# Patient Record
Sex: Female | Born: 1935 | Race: White | Hispanic: No | Marital: Single | Smoking: Former smoker
Health system: Southern US, Community
[De-identification: ages and names within clinical notes are randomized; demographics above are authoritative.]

## PROBLEM LIST (undated history)

## (undated) DIAGNOSIS — N289 Disorder of kidney and ureter, unspecified: Secondary | ICD-10-CM

## (undated) DIAGNOSIS — I10 Essential (primary) hypertension: Secondary | ICD-10-CM

## (undated) DIAGNOSIS — F0390 Unspecified dementia without behavioral disturbance: Secondary | ICD-10-CM

## (undated) HISTORY — PX: HERNIA REPAIR: SHX51

## (undated) HISTORY — PX: BACK SURGERY: SHX140

---

## 1998-04-21 ENCOUNTER — Observation Stay (HOSPITAL_COMMUNITY): Admission: RE | Admit: 1998-04-21 | Discharge: 1998-04-22 | Payer: Self-pay | Admitting: Surgery

## 1998-04-21 ENCOUNTER — Encounter: Payer: Self-pay | Admitting: Surgery

## 1999-06-09 ENCOUNTER — Encounter: Payer: Self-pay | Admitting: Surgery

## 1999-06-09 ENCOUNTER — Encounter: Admission: RE | Admit: 1999-06-09 | Discharge: 1999-06-09 | Payer: Self-pay | Admitting: Surgery

## 1999-06-09 ENCOUNTER — Other Ambulatory Visit: Admission: RE | Admit: 1999-06-09 | Discharge: 1999-06-09 | Payer: Self-pay | Admitting: Gastroenterology

## 1999-06-09 ENCOUNTER — Encounter (INDEPENDENT_AMBULATORY_CARE_PROVIDER_SITE_OTHER): Payer: Self-pay | Admitting: Specialist

## 2000-01-26 ENCOUNTER — Encounter: Payer: Self-pay | Admitting: Family Medicine

## 2000-01-26 ENCOUNTER — Encounter: Admission: RE | Admit: 2000-01-26 | Discharge: 2000-01-26 | Payer: Self-pay | Admitting: Family Medicine

## 2000-02-05 ENCOUNTER — Encounter: Payer: Self-pay | Admitting: General Surgery

## 2000-02-10 ENCOUNTER — Encounter (INDEPENDENT_AMBULATORY_CARE_PROVIDER_SITE_OTHER): Payer: Self-pay

## 2000-02-10 ENCOUNTER — Inpatient Hospital Stay (HOSPITAL_COMMUNITY): Admission: RE | Admit: 2000-02-10 | Discharge: 2000-02-13 | Payer: Self-pay | Admitting: General Surgery

## 2000-07-12 ENCOUNTER — Encounter: Admission: RE | Admit: 2000-07-12 | Discharge: 2000-07-12 | Payer: Self-pay | Admitting: Family Medicine

## 2000-07-12 ENCOUNTER — Encounter: Payer: Self-pay | Admitting: Family Medicine

## 2003-09-12 ENCOUNTER — Encounter: Admission: RE | Admit: 2003-09-12 | Discharge: 2003-09-12 | Payer: Self-pay | Admitting: Family Medicine

## 2003-09-27 ENCOUNTER — Encounter: Admission: RE | Admit: 2003-09-27 | Discharge: 2003-09-27 | Payer: Self-pay | Admitting: Neurological Surgery

## 2003-09-29 ENCOUNTER — Ambulatory Visit (HOSPITAL_COMMUNITY): Admission: RE | Admit: 2003-09-29 | Discharge: 2003-09-29 | Payer: Self-pay | Admitting: Neurological Surgery

## 2003-10-08 ENCOUNTER — Ambulatory Visit (HOSPITAL_COMMUNITY): Admission: RE | Admit: 2003-10-08 | Discharge: 2003-10-08 | Payer: Self-pay | Admitting: Neurological Surgery

## 2003-10-14 ENCOUNTER — Ambulatory Visit (HOSPITAL_COMMUNITY): Admission: RE | Admit: 2003-10-14 | Discharge: 2003-10-14 | Payer: Self-pay | Admitting: Unknown Physician Specialty

## 2010-05-15 ENCOUNTER — Ambulatory Visit: Payer: Self-pay | Admitting: Diagnostic Radiology

## 2010-05-15 ENCOUNTER — Encounter: Payer: Self-pay | Admitting: Emergency Medicine

## 2010-07-02 ENCOUNTER — Inpatient Hospital Stay (HOSPITAL_COMMUNITY): Admission: EM | Admit: 2010-07-02 | Discharge: 2010-05-17 | Payer: Self-pay | Admitting: Internal Medicine

## 2010-10-07 LAB — LIPID PANEL
LDL Cholesterol: 112 mg/dL — ABNORMAL HIGH (ref 0–99)
Total CHOL/HDL Ratio: 3.8 RATIO
Triglycerides: 102 mg/dL (ref <150)
VLDL: 20 mg/dL (ref 0–40)

## 2010-10-07 LAB — CARDIAC PANEL(CRET KIN+CKTOT+MB+TROPI)
CK, MB: 2.8 ng/mL (ref 0.3–4.0)
CK, MB: 3.3 ng/mL (ref 0.3–4.0)
Total CK: 129 U/L (ref 7–177)
Troponin I: 0.01 ng/mL (ref 0.00–0.06)

## 2010-10-07 LAB — DIFFERENTIAL
Basophils Absolute: 0 10*3/uL (ref 0.0–0.1)
Basophils Relative: 0 % (ref 0–1)
Eosinophils Absolute: 0.8 10*3/uL — ABNORMAL HIGH (ref 0.0–0.7)
Eosinophils Relative: 11 % — ABNORMAL HIGH (ref 0–5)
Lymphocytes Relative: 24 % (ref 12–46)
Monocytes Absolute: 0.8 10*3/uL (ref 0.1–1.0)
Monocytes Absolute: 0.5 10*3/uL (ref 0.1–1.0)
Monocytes Relative: 10 % (ref 3–12)
Monocytes Relative: 8 % (ref 3–12)
Neutro Abs: 4 10*3/uL (ref 1.7–7.7)
Neutro Abs: 4.1 10*3/uL (ref 1.7–7.7)

## 2010-10-07 LAB — TSH: TSH: 1.264 u[IU]/mL (ref 0.350–4.500)

## 2010-10-07 LAB — POCT CARDIAC MARKERS
CKMB, poc: 1.4 ng/mL (ref 1.0–8.0)
Troponin i, poc: <0.05 ng/mL (ref 0.00–0.09)

## 2010-10-07 LAB — BASIC METABOLIC PANEL
BUN: 33 mg/dL — ABNORMAL HIGH (ref 6–23)
Calcium: 8.7 mg/dL (ref 8.4–10.5)
Chloride: 112 mEq/L (ref 96–112)
Chloride: 107 mEq/L (ref 96–112)
Creatinine, Ser: 1.19 mg/dL (ref 0.4–1.2)
GFR calc non Af Amer: 43 mL/min — ABNORMAL LOW (ref >60)
GFR calc non Af Amer: 43 mL/min — ABNORMAL LOW (ref >60)
GFR calc non Af Amer: 38 mL/min — ABNORMAL LOW (ref >60)
Glucose, Bld: 106 mg/dL — ABNORMAL HIGH (ref 70–99)
Glucose, Bld: 93 mg/dL (ref 70–99)
Glucose, Bld: 93 mg/dL (ref 70–99)
Potassium: 4.1 mEq/L (ref 3.5–5.1)
Potassium: 5.2 mEq/L — ABNORMAL HIGH (ref 3.5–5.1)
Sodium: 138 mEq/L (ref 135–145)
Sodium: 141 mEq/L (ref 135–145)

## 2010-10-07 LAB — CBC
HCT: 32.7 % — ABNORMAL LOW (ref 36.0–46.0)
HCT: 35.2 % — ABNORMAL LOW (ref 36.0–46.0)
Hemoglobin: 10.9 g/dL — ABNORMAL LOW (ref 12.0–15.0)
Hemoglobin: 11.8 g/dL — ABNORMAL LOW (ref 12.0–15.0)
MCH: 30.8 pg (ref 26.0–34.0)
MCHC: 33.3 g/dL (ref 30.0–36.0)
MCHC: 33.6 g/dL (ref 30.0–36.0)
MCV: 92.4 fL (ref 78.0–100.0)
MCV: 95.1 fL (ref 78.0–100.0)
RDW: 13.9 % (ref 11.5–15.5)
WBC: 7 10*3/uL (ref 4.0–10.5)

## 2010-10-07 LAB — MRSA PCR SCREENING: MRSA by PCR: NEGATIVE

## 2010-12-11 NOTE — Op Note (Signed)
[facility]  Patient:    Amy Moody                   MRN: [id number] Proc. Date: 02/10/00 Adm. Date:  [id number] Attending:  Henrene Dodge CC:         Anselm Pancoast. Zachery Dakins, M.D.                           Operative Report  PREOPERATIVE DIAGNOSES:  Giant incisional hernia status post herniorrhaphies with mesh reinforcement x 2.  POSTOPERATIVE DIAGNOSES:  Giant incisional hernia status post herniorrhaphies with mesh reinforcement x 2.  OPERATION PERFORMED:  Excisional herniorrhaphy.  ANESTHESIA:  General.  SURGEON:  Anselm Pancoast. Zachery Dakins, M.D.  ASSISTANT:  Donnie Coffin. Samuella Cota, M.D.  HISTORY OF PRESENT ILLNESS:  Amy Moody is an 77 year old slightly overweight Caucasian female whose been a patient of Dr. Butch Penny for many years. I think she first had an incisional hernia and an old GYN type incision approximately 10 years ago and then approximately, I think, 2 years ago had a bowel obstruction with a hernia above the mesh that was repaired by Dr. Orson Slick. Shortly afterwards, she developed a cholecystitis or actually was having biliary colic and had a laparoscopic cholecystectomy by Dr. Ezzard Standing and now she returns to the office with a large hernia. She actually has a big defect basically football size and then also a definite orange size defect up in the upper epigastric area where the actual 10 mm trocar was placed. She does not want to wait until Dr. Ezzard Standing returns from his trip to [place] and asked that I proceed on with herniorrhaphy as she was having a moderate amount of pain. However, we have not noticed any actual bowel obstruction and she has not blood in the stool and she had a recent CT that was nothing except for the large hernias noted. She had a mechanical bowel prep preoperatively and was given a gram of Kefzol with PAS stockings and was taken to the operative suite. I prepped the abdomen with Betadine surgical scrub and  solution and a Foley catheter had been inserted sterilely and then the epigastric and midline area was all draped. I made the first incision above the umbilicus from the umbilicus to about halfway to the subxiphoid area, dissected down and then basically encompassed this large hernia. She does actually have a defect about 6 inches by about 4 inches but then the mesh had just lifted and the mesh had been placed on top of the abdomen or top of the fascia when Dr. Orson Slick had repaired his herniorrhaphy. This I tried to separate and save the actual hernia sac so I could just place everything extraperitoneally but it was impossible and it was necessary to go within the peritoneal cavity. She does not have an enormous or large omentum but was separated from the fascia and also the old mesh and the small bowel was normal appearing. Good hemostasis was obtained and then the piece of mesh that Dr. Ezzard Standing had placed. I think it had actually been placed preperitoneally from within the peritoneal cavity and this basically had held at the bottom aspect but had given way for Dr. Orson Slick where Dr. Orson Slick had repaired the hernia. Feeling up in the upper abdomen, the little fascia defect was about the size about an inch and a half or so and then she had a big mushroom up above that from the hernia sac.  We made a little incision above the skin where trocar site was and then after this the hernia sac was freed up around the fascia. Basically fixed it so a large piece of Prolene mesh could be placed really outside the peritoneum but there is not much of a posterior rectus fascia and it was kind of up on the rectus muscle on both sides. I tried to close the peritoneum the best we could, of course she has had such a large fascia defect that the area right at the umbilicus and everything a portion of it was up and down and part was kind of transverse but it appears to be intact. Sponge count was correct in this and  this large piece of mesh is probably 8 inches x 12 inches in size sort of tapered down a little bit up at the top part. First we anchored the mesh and then the preperitoneal position at the xiphoid area and then anchored the second stitch down really right above the bladder. I first worked on the right lateral side going the mesh just as far laterally as possible and then after this was anchored with probably about 8 sutures on that side then the similar was done on the left side switching the surgeons side. We then closed the fascia bringing it down to the mesh. It was not possible to actually bring the fascia at the xiphoid or the umbilicus area. The fascia was then sutured to the mesh. There was probably about a strip about an 1.5 inches x 3 inches that there was no fascia over the mesh. She was kind of getting a little light and the mesh appears to be lying flat and I dont think there is any excessive tension areas. I did place a 19 Blake drain up under the muscle on top of the mesh on the left side and brought it around. Next, the subcutaneous tissue of the fascia had been closed as possible with #0 Prolenes and then the fascia right around from it couldnt get the fascia together and then closed to the mesh with the #0 Prolenes. Next, the subcutaneous tissue was closed with 2-0 or 3-0 Vicryl and then the skin was closed with staples after a few interrupted sutures had been placed in the subcuticular area. At this time, she was actually breathing on her own and the abdomen is flat. I think that hopefully this will not rip out because of the large areas that we went under the fascia in all directions. I then put skin staples and then placed sterile occlusive dressings to drain and then suture to the skin and then the abdominal binder after the dressing had been applied. The patient tolerated the procedure satisfactory was extubated and taken to the recovery room stable  postop condition. DD:  02/10/00 TD:  02/12/00 Job: [id number]

## 2010-12-11 NOTE — Discharge Summary (Signed)
[facility]  Patient:    Amy Moody, Amy Moody                   MRN: [id number] Adm. Date:  [id number] Disc. Date: [id number] Attending:  Henrene Dodge CC:         Anselm Pancoast. Zachery Dakins, M.D.   Discharge Summary  DISCHARGE DIAGNOSIS:  Multiple incisional hernias  OPERATION:  Repair of multiple incisional hernias with a large piece of Prolene mesh under general anesthesia.  HISTORY:  The patient is an 77 year old Caucasian female who has had multiple previous abdominal surgeries, most for repair of incisional hernias.  She originally had been operated on by Dr. Ezzard Standing after she had had gynecological procedure with a lower incisional hernia, and then several years after that had an incarcerated hernia above the piece of mesh.  It was operated by Dr. Ezzard Standing.  Later, she had chronic cholecystits, and Dr. Ezzard Standing operated on her with a laparoscopic cholecystectomy, and this was approximately two years ago.  She has done nicely with the exception that she has developed a large hernia at the umbilicus and also a large defect at the upper 10 mm trocar site.  She saw me in the office Dr. Butch Penny absence because of pain.  She had about a football size defect of the umbilicus, and I recommended that this be repaired, and she desired to proceed at that time.   Dr. Dewaine Oats is her regular physician.  She has had multiple lower abdominal surgeries, gynecological, and I thought that it would be best to try to repair this with a large piece of mesh placed within the muscle layers but extraperiotneally.  She was taken to surgery.  Dr. Rozetta Nunnery assisted in her surgery.  We were able to take down all of the basically incisional hernias.  We could keep the peritoneum predominantly intact and closed the peritoneum and then placed a piece of mesh measuring approximately 9 x 12 inches basically within the muscle layer, tacked this in place with lateral sutures, and  then closed the fascia over the mesh.  A Jackson-Pratt drain was placed and then closed the abdominal incision, and we kept her n.p.o. for approximately two days because of the magnitude of her dissection and surgeries.  She then started having some bowel function.  The nausea subsided, and we started her on a liquid diet.  She was ready for discharge in improved condition on approximately the third postoperative day.  She still has her Jackson-Pratt drain in place and will see me in the office to have the drain removed in approximately three to four days.  She was discharged on Tylenol and Vicodin for pain, her usual medications, which were not really many, and instructed to wear the binder on at all times. She is discharged in improved condition and is to use laxatives if needed for bowels. DD:  02/23/00 TD:  02/24/00 Job: [id number]

## 2011-08-25 DIAGNOSIS — L608 Other nail disorders: Secondary | ICD-10-CM | POA: Diagnosis not present

## 2011-09-21 DIAGNOSIS — R3915 Urgency of urination: Secondary | ICD-10-CM | POA: Diagnosis not present

## 2011-10-11 DIAGNOSIS — I10 Essential (primary) hypertension: Secondary | ICD-10-CM | POA: Diagnosis not present

## 2012-01-09 DIAGNOSIS — M25519 Pain in unspecified shoulder: Secondary | ICD-10-CM | POA: Diagnosis not present

## 2012-02-10 DIAGNOSIS — F0280 Dementia in other diseases classified elsewhere without behavioral disturbance: Secondary | ICD-10-CM | POA: Diagnosis not present

## 2012-02-10 DIAGNOSIS — I10 Essential (primary) hypertension: Secondary | ICD-10-CM | POA: Diagnosis not present

## 2012-02-10 DIAGNOSIS — N183 Chronic kidney disease, stage 3 unspecified: Secondary | ICD-10-CM | POA: Diagnosis not present

## 2012-02-10 DIAGNOSIS — G309 Alzheimer's disease, unspecified: Secondary | ICD-10-CM | POA: Diagnosis not present

## 2012-03-29 DIAGNOSIS — Z961 Presence of intraocular lens: Secondary | ICD-10-CM | POA: Diagnosis not present

## 2012-03-29 DIAGNOSIS — H1045 Other chronic allergic conjunctivitis: Secondary | ICD-10-CM | POA: Diagnosis not present

## 2012-03-29 DIAGNOSIS — H04129 Dry eye syndrome of unspecified lacrimal gland: Secondary | ICD-10-CM | POA: Diagnosis not present

## 2012-03-29 DIAGNOSIS — H26499 Other secondary cataract, unspecified eye: Secondary | ICD-10-CM | POA: Diagnosis not present

## 2012-04-24 DIAGNOSIS — Z23 Encounter for immunization: Secondary | ICD-10-CM | POA: Diagnosis not present

## 2012-05-18 DIAGNOSIS — B86 Scabies: Secondary | ICD-10-CM | POA: Diagnosis not present

## 2012-07-25 DIAGNOSIS — R42 Dizziness and giddiness: Secondary | ICD-10-CM | POA: Diagnosis not present

## 2012-08-14 DIAGNOSIS — R82998 Other abnormal findings in urine: Secondary | ICD-10-CM | POA: Diagnosis not present

## 2012-08-14 DIAGNOSIS — N183 Chronic kidney disease, stage 3 unspecified: Secondary | ICD-10-CM | POA: Diagnosis not present

## 2012-08-14 DIAGNOSIS — R42 Dizziness and giddiness: Secondary | ICD-10-CM | POA: Diagnosis not present

## 2013-01-09 DIAGNOSIS — R05 Cough: Secondary | ICD-10-CM | POA: Diagnosis not present

## 2013-01-09 DIAGNOSIS — R059 Cough, unspecified: Secondary | ICD-10-CM | POA: Diagnosis not present

## 2013-02-19 DIAGNOSIS — R059 Cough, unspecified: Secondary | ICD-10-CM | POA: Diagnosis not present

## 2013-02-19 DIAGNOSIS — F0280 Dementia in other diseases classified elsewhere without behavioral disturbance: Secondary | ICD-10-CM | POA: Diagnosis not present

## 2013-02-19 DIAGNOSIS — L2089 Other atopic dermatitis: Secondary | ICD-10-CM | POA: Diagnosis not present

## 2013-02-19 DIAGNOSIS — N183 Chronic kidney disease, stage 3 unspecified: Secondary | ICD-10-CM | POA: Diagnosis not present

## 2013-02-19 DIAGNOSIS — G309 Alzheimer's disease, unspecified: Secondary | ICD-10-CM | POA: Diagnosis not present

## 2013-02-19 DIAGNOSIS — R05 Cough: Secondary | ICD-10-CM | POA: Diagnosis not present

## 2013-02-19 DIAGNOSIS — I10 Essential (primary) hypertension: Secondary | ICD-10-CM | POA: Diagnosis not present

## 2013-04-20 DIAGNOSIS — Z23 Encounter for immunization: Secondary | ICD-10-CM | POA: Diagnosis not present

## 2013-08-22 DIAGNOSIS — N183 Chronic kidney disease, stage 3 unspecified: Secondary | ICD-10-CM | POA: Diagnosis not present

## 2013-08-22 DIAGNOSIS — M545 Low back pain, unspecified: Secondary | ICD-10-CM | POA: Diagnosis not present

## 2013-08-22 DIAGNOSIS — L609 Nail disorder, unspecified: Secondary | ICD-10-CM | POA: Diagnosis not present

## 2013-08-22 DIAGNOSIS — F0280 Dementia in other diseases classified elsewhere without behavioral disturbance: Secondary | ICD-10-CM | POA: Diagnosis not present

## 2013-08-22 DIAGNOSIS — G309 Alzheimer's disease, unspecified: Secondary | ICD-10-CM | POA: Diagnosis not present

## 2013-08-22 DIAGNOSIS — I10 Essential (primary) hypertension: Secondary | ICD-10-CM | POA: Diagnosis not present

## 2014-03-25 DIAGNOSIS — I10 Essential (primary) hypertension: Secondary | ICD-10-CM | POA: Diagnosis not present

## 2014-03-25 DIAGNOSIS — B351 Tinea unguium: Secondary | ICD-10-CM | POA: Diagnosis not present

## 2014-03-25 DIAGNOSIS — F0280 Dementia in other diseases classified elsewhere without behavioral disturbance: Secondary | ICD-10-CM | POA: Diagnosis not present

## 2014-03-25 DIAGNOSIS — G309 Alzheimer's disease, unspecified: Secondary | ICD-10-CM | POA: Diagnosis not present

## 2014-03-25 DIAGNOSIS — L299 Pruritus, unspecified: Secondary | ICD-10-CM | POA: Diagnosis not present

## 2014-03-25 DIAGNOSIS — N183 Chronic kidney disease, stage 3 unspecified: Secondary | ICD-10-CM | POA: Diagnosis not present

## 2014-04-26 DIAGNOSIS — Z23 Encounter for immunization: Secondary | ICD-10-CM | POA: Diagnosis not present

## 2014-05-23 DIAGNOSIS — S8990XA Unspecified injury of unspecified lower leg, initial encounter: Secondary | ICD-10-CM | POA: Diagnosis not present

## 2014-05-23 DIAGNOSIS — S99921A Unspecified injury of right foot, initial encounter: Secondary | ICD-10-CM | POA: Diagnosis not present

## 2014-05-23 DIAGNOSIS — S92411A Displaced fracture of proximal phalanx of right great toe, initial encounter for closed fracture: Secondary | ICD-10-CM | POA: Diagnosis not present

## 2014-06-10 DIAGNOSIS — S92411S Displaced fracture of proximal phalanx of right great toe, sequela: Secondary | ICD-10-CM | POA: Diagnosis not present

## 2014-09-16 DIAGNOSIS — I129 Hypertensive chronic kidney disease with stage 1 through stage 4 chronic kidney disease, or unspecified chronic kidney disease: Secondary | ICD-10-CM | POA: Diagnosis not present

## 2014-09-16 DIAGNOSIS — N183 Chronic kidney disease, stage 3 (moderate): Secondary | ICD-10-CM | POA: Diagnosis not present

## 2014-09-16 DIAGNOSIS — M545 Low back pain: Secondary | ICD-10-CM | POA: Diagnosis not present

## 2014-09-16 DIAGNOSIS — S92414G Nondisplaced fracture of proximal phalanx of right great toe, subsequent encounter for fracture with delayed healing: Secondary | ICD-10-CM | POA: Diagnosis not present

## 2014-09-16 DIAGNOSIS — G301 Alzheimer's disease with late onset: Secondary | ICD-10-CM | POA: Diagnosis not present

## 2015-03-17 DIAGNOSIS — N183 Chronic kidney disease, stage 3 (moderate): Secondary | ICD-10-CM | POA: Diagnosis not present

## 2015-03-17 DIAGNOSIS — G301 Alzheimer's disease with late onset: Secondary | ICD-10-CM | POA: Diagnosis not present

## 2015-03-17 DIAGNOSIS — I129 Hypertensive chronic kidney disease with stage 1 through stage 4 chronic kidney disease, or unspecified chronic kidney disease: Secondary | ICD-10-CM | POA: Diagnosis not present

## 2015-05-06 DIAGNOSIS — Z23 Encounter for immunization: Secondary | ICD-10-CM | POA: Diagnosis not present

## 2015-08-08 DIAGNOSIS — J988 Other specified respiratory disorders: Secondary | ICD-10-CM | POA: Diagnosis not present

## 2015-08-08 DIAGNOSIS — R05 Cough: Secondary | ICD-10-CM | POA: Diagnosis not present

## 2015-12-19 DIAGNOSIS — Z87891 Personal history of nicotine dependence: Secondary | ICD-10-CM | POA: Diagnosis not present

## 2015-12-19 DIAGNOSIS — R55 Syncope and collapse: Secondary | ICD-10-CM | POA: Diagnosis not present

## 2015-12-19 DIAGNOSIS — K219 Gastro-esophageal reflux disease without esophagitis: Secondary | ICD-10-CM | POA: Diagnosis not present

## 2015-12-19 DIAGNOSIS — Z79899 Other long term (current) drug therapy: Secondary | ICD-10-CM | POA: Diagnosis not present

## 2015-12-19 DIAGNOSIS — T671XXA Heat syncope, initial encounter: Secondary | ICD-10-CM | POA: Diagnosis not present

## 2015-12-19 DIAGNOSIS — R404 Transient alteration of awareness: Secondary | ICD-10-CM | POA: Diagnosis not present

## 2015-12-19 DIAGNOSIS — F0390 Unspecified dementia without behavioral disturbance: Secondary | ICD-10-CM | POA: Diagnosis not present

## 2015-12-19 DIAGNOSIS — K279 Peptic ulcer, site unspecified, unspecified as acute or chronic, without hemorrhage or perforation: Secondary | ICD-10-CM | POA: Diagnosis not present

## 2015-12-19 DIAGNOSIS — I10 Essential (primary) hypertension: Secondary | ICD-10-CM | POA: Diagnosis not present

## 2015-12-19 DIAGNOSIS — F0280 Dementia in other diseases classified elsewhere without behavioral disturbance: Secondary | ICD-10-CM | POA: Diagnosis not present

## 2015-12-19 DIAGNOSIS — G301 Alzheimer's disease with late onset: Secondary | ICD-10-CM | POA: Diagnosis not present

## 2015-12-19 DIAGNOSIS — G9389 Other specified disorders of brain: Secondary | ICD-10-CM | POA: Diagnosis not present

## 2015-12-19 DIAGNOSIS — R410 Disorientation, unspecified: Secondary | ICD-10-CM | POA: Diagnosis not present

## 2015-12-20 DIAGNOSIS — S8000XA Contusion of unspecified knee, initial encounter: Secondary | ICD-10-CM | POA: Diagnosis not present

## 2015-12-20 DIAGNOSIS — M542 Cervicalgia: Secondary | ICD-10-CM | POA: Diagnosis not present

## 2015-12-20 DIAGNOSIS — Z23 Encounter for immunization: Secondary | ICD-10-CM | POA: Diagnosis not present

## 2015-12-20 DIAGNOSIS — Z87891 Personal history of nicotine dependence: Secondary | ICD-10-CM | POA: Diagnosis not present

## 2015-12-20 DIAGNOSIS — S06300A Unspecified focal traumatic brain injury without loss of consciousness, initial encounter: Secondary | ICD-10-CM | POA: Diagnosis not present

## 2015-12-20 DIAGNOSIS — M25562 Pain in left knee: Secondary | ICD-10-CM | POA: Diagnosis not present

## 2015-12-20 DIAGNOSIS — I629 Nontraumatic intracranial hemorrhage, unspecified: Secondary | ICD-10-CM | POA: Diagnosis not present

## 2015-12-20 DIAGNOSIS — S8002XA Contusion of left knee, initial encounter: Secondary | ICD-10-CM | POA: Diagnosis not present

## 2015-12-20 DIAGNOSIS — S0990XA Unspecified injury of head, initial encounter: Secondary | ICD-10-CM | POA: Diagnosis not present

## 2015-12-20 DIAGNOSIS — S01112A Laceration without foreign body of left eyelid and periocular area, initial encounter: Secondary | ICD-10-CM | POA: Diagnosis not present

## 2016-02-20 DIAGNOSIS — R55 Syncope and collapse: Secondary | ICD-10-CM | POA: Diagnosis not present

## 2016-05-04 DIAGNOSIS — Z23 Encounter for immunization: Secondary | ICD-10-CM | POA: Diagnosis not present

## 2016-07-21 DIAGNOSIS — I10 Essential (primary) hypertension: Secondary | ICD-10-CM | POA: Diagnosis not present

## 2016-07-21 DIAGNOSIS — N183 Chronic kidney disease, stage 3 (moderate): Secondary | ICD-10-CM | POA: Diagnosis not present

## 2016-07-21 DIAGNOSIS — W19XXXA Unspecified fall, initial encounter: Secondary | ICD-10-CM | POA: Diagnosis not present

## 2016-07-22 DIAGNOSIS — W19XXXA Unspecified fall, initial encounter: Secondary | ICD-10-CM | POA: Diagnosis not present

## 2016-07-22 DIAGNOSIS — I10 Essential (primary) hypertension: Secondary | ICD-10-CM | POA: Diagnosis not present

## 2016-07-22 DIAGNOSIS — N390 Urinary tract infection, site not specified: Secondary | ICD-10-CM | POA: Diagnosis not present

## 2016-08-16 DIAGNOSIS — N390 Urinary tract infection, site not specified: Secondary | ICD-10-CM | POA: Diagnosis not present

## 2016-08-16 DIAGNOSIS — I129 Hypertensive chronic kidney disease with stage 1 through stage 4 chronic kidney disease, or unspecified chronic kidney disease: Secondary | ICD-10-CM | POA: Diagnosis not present

## 2016-08-17 DIAGNOSIS — I129 Hypertensive chronic kidney disease with stage 1 through stage 4 chronic kidney disease, or unspecified chronic kidney disease: Secondary | ICD-10-CM | POA: Diagnosis not present

## 2016-08-17 DIAGNOSIS — N390 Urinary tract infection, site not specified: Secondary | ICD-10-CM | POA: Diagnosis not present

## 2016-09-10 DIAGNOSIS — M25562 Pain in left knee: Secondary | ICD-10-CM | POA: Diagnosis not present

## 2016-09-10 DIAGNOSIS — W19XXXA Unspecified fall, initial encounter: Secondary | ICD-10-CM | POA: Diagnosis not present

## 2016-09-10 DIAGNOSIS — M25532 Pain in left wrist: Secondary | ICD-10-CM | POA: Diagnosis not present

## 2016-10-05 DIAGNOSIS — M25561 Pain in right knee: Secondary | ICD-10-CM | POA: Diagnosis not present

## 2017-02-03 DIAGNOSIS — R319 Hematuria, unspecified: Secondary | ICD-10-CM | POA: Diagnosis not present

## 2017-02-03 DIAGNOSIS — N390 Urinary tract infection, site not specified: Secondary | ICD-10-CM | POA: Diagnosis not present

## 2017-02-03 DIAGNOSIS — I10 Essential (primary) hypertension: Secondary | ICD-10-CM | POA: Diagnosis not present

## 2017-02-03 DIAGNOSIS — N183 Chronic kidney disease, stage 3 (moderate): Secondary | ICD-10-CM | POA: Diagnosis not present

## 2017-02-08 DIAGNOSIS — M179 Osteoarthritis of knee, unspecified: Secondary | ICD-10-CM | POA: Diagnosis not present

## 2017-02-08 DIAGNOSIS — G301 Alzheimer's disease with late onset: Secondary | ICD-10-CM | POA: Diagnosis not present

## 2017-02-08 DIAGNOSIS — Z0000 Encounter for general adult medical examination without abnormal findings: Secondary | ICD-10-CM | POA: Diagnosis not present

## 2017-02-08 DIAGNOSIS — I10 Essential (primary) hypertension: Secondary | ICD-10-CM | POA: Diagnosis not present

## 2017-02-08 DIAGNOSIS — N183 Chronic kidney disease, stage 3 (moderate): Secondary | ICD-10-CM | POA: Diagnosis not present

## 2017-04-07 DIAGNOSIS — M1711 Unilateral primary osteoarthritis, right knee: Secondary | ICD-10-CM | POA: Diagnosis not present

## 2017-04-07 DIAGNOSIS — M25561 Pain in right knee: Secondary | ICD-10-CM | POA: Diagnosis not present

## 2017-04-07 DIAGNOSIS — G8929 Other chronic pain: Secondary | ICD-10-CM | POA: Diagnosis not present

## 2017-05-10 DIAGNOSIS — Z23 Encounter for immunization: Secondary | ICD-10-CM | POA: Diagnosis not present

## 2017-07-28 ENCOUNTER — Emergency Department (HOSPITAL_COMMUNITY): Payer: Medicare Other

## 2017-07-28 ENCOUNTER — Inpatient Hospital Stay (HOSPITAL_COMMUNITY)
Admission: EM | Admit: 2017-07-28 | Discharge: 2017-08-02 | DRG: 481 | Disposition: A | Discharge status: Skilled Nursing Facility | Payer: Medicare Other | Attending: Family Medicine | Admitting: Family Medicine

## 2017-07-28 ENCOUNTER — Other Ambulatory Visit: Payer: Self-pay

## 2017-07-28 ENCOUNTER — Encounter (HOSPITAL_COMMUNITY): Payer: Self-pay | Admitting: Family Medicine

## 2017-07-28 DIAGNOSIS — N183 Chronic kidney disease, stage 3 (moderate): Secondary | ICD-10-CM | POA: Diagnosis present

## 2017-07-28 DIAGNOSIS — I129 Hypertensive chronic kidney disease with stage 1 through stage 4 chronic kidney disease, or unspecified chronic kidney disease: Secondary | ICD-10-CM | POA: Diagnosis present

## 2017-07-28 DIAGNOSIS — M25551 Pain in right hip: Secondary | ICD-10-CM | POA: Diagnosis not present

## 2017-07-28 DIAGNOSIS — F0390 Unspecified dementia without behavioral disturbance: Secondary | ICD-10-CM | POA: Diagnosis not present

## 2017-07-28 DIAGNOSIS — T84114A Breakdown (mechanical) of internal fixation device of right femur, initial encounter: Secondary | ICD-10-CM | POA: Diagnosis not present

## 2017-07-28 DIAGNOSIS — Z7982 Long term (current) use of aspirin: Secondary | ICD-10-CM | POA: Diagnosis not present

## 2017-07-28 DIAGNOSIS — D62 Acute posthemorrhagic anemia: Secondary | ICD-10-CM | POA: Diagnosis not present

## 2017-07-28 DIAGNOSIS — L89309 Pressure ulcer of unspecified buttock, unspecified stage: Secondary | ICD-10-CM | POA: Diagnosis present

## 2017-07-28 DIAGNOSIS — D500 Iron deficiency anemia secondary to blood loss (chronic): Secondary | ICD-10-CM | POA: Diagnosis not present

## 2017-07-28 DIAGNOSIS — Z01818 Encounter for other preprocedural examination: Secondary | ICD-10-CM

## 2017-07-28 DIAGNOSIS — Z9181 History of falling: Secondary | ICD-10-CM | POA: Diagnosis not present

## 2017-07-28 DIAGNOSIS — S72141D Displaced intertrochanteric fracture of right femur, subsequent encounter for closed fracture with routine healing: Secondary | ICD-10-CM | POA: Diagnosis not present

## 2017-07-28 DIAGNOSIS — G8911 Acute pain due to trauma: Secondary | ICD-10-CM | POA: Diagnosis not present

## 2017-07-28 DIAGNOSIS — T148XXA Other injury of unspecified body region, initial encounter: Secondary | ICD-10-CM | POA: Diagnosis not present

## 2017-07-28 DIAGNOSIS — D649 Anemia, unspecified: Secondary | ICD-10-CM | POA: Diagnosis present

## 2017-07-28 DIAGNOSIS — R339 Retention of urine, unspecified: Secondary | ICD-10-CM | POA: Diagnosis present

## 2017-07-28 DIAGNOSIS — Z419 Encounter for procedure for purposes other than remedying health state, unspecified: Secondary | ICD-10-CM

## 2017-07-28 DIAGNOSIS — R4789 Other speech disturbances: Secondary | ICD-10-CM | POA: Diagnosis not present

## 2017-07-28 DIAGNOSIS — R1311 Dysphagia, oral phase: Secondary | ICD-10-CM | POA: Diagnosis not present

## 2017-07-28 DIAGNOSIS — S72001D Fracture of unspecified part of neck of right femur, subsequent encounter for closed fracture with routine healing: Secondary | ICD-10-CM | POA: Diagnosis not present

## 2017-07-28 DIAGNOSIS — M170 Bilateral primary osteoarthritis of knee: Secondary | ICD-10-CM | POA: Diagnosis not present

## 2017-07-28 DIAGNOSIS — I10 Essential (primary) hypertension: Secondary | ICD-10-CM | POA: Diagnosis not present

## 2017-07-28 DIAGNOSIS — S72009A Fracture of unspecified part of neck of unspecified femur, initial encounter for closed fracture: Secondary | ICD-10-CM | POA: Diagnosis not present

## 2017-07-28 DIAGNOSIS — E8770 Fluid overload, unspecified: Secondary | ICD-10-CM | POA: Diagnosis present

## 2017-07-28 DIAGNOSIS — Z87891 Personal history of nicotine dependence: Secondary | ICD-10-CM | POA: Diagnosis not present

## 2017-07-28 DIAGNOSIS — W010XXA Fall on same level from slipping, tripping and stumbling without subsequent striking against object, initial encounter: Secondary | ICD-10-CM | POA: Diagnosis present

## 2017-07-28 DIAGNOSIS — R32 Unspecified urinary incontinence: Secondary | ICD-10-CM | POA: Diagnosis not present

## 2017-07-28 DIAGNOSIS — Z66 Do not resuscitate: Secondary | ICD-10-CM | POA: Diagnosis present

## 2017-07-28 DIAGNOSIS — M6281 Muscle weakness (generalized): Secondary | ICD-10-CM | POA: Diagnosis not present

## 2017-07-28 DIAGNOSIS — N179 Acute kidney failure, unspecified: Secondary | ICD-10-CM | POA: Diagnosis present

## 2017-07-28 DIAGNOSIS — Y92009 Unspecified place in unspecified non-institutional (private) residence as the place of occurrence of the external cause: Secondary | ICD-10-CM | POA: Diagnosis not present

## 2017-07-28 DIAGNOSIS — J810 Acute pulmonary edema: Secondary | ICD-10-CM | POA: Diagnosis not present

## 2017-07-28 DIAGNOSIS — S72001A Fracture of unspecified part of neck of right femur, initial encounter for closed fracture: Principal | ICD-10-CM | POA: Diagnosis present

## 2017-07-28 DIAGNOSIS — N189 Chronic kidney disease, unspecified: Secondary | ICD-10-CM | POA: Diagnosis not present

## 2017-07-28 DIAGNOSIS — N289 Disorder of kidney and ureter, unspecified: Secondary | ICD-10-CM | POA: Diagnosis not present

## 2017-07-28 DIAGNOSIS — K5900 Constipation, unspecified: Secondary | ICD-10-CM | POA: Diagnosis not present

## 2017-07-28 DIAGNOSIS — I517 Cardiomegaly: Secondary | ICD-10-CM | POA: Diagnosis present

## 2017-07-28 DIAGNOSIS — S72141A Displaced intertrochanteric fracture of right femur, initial encounter for closed fracture: Secondary | ICD-10-CM | POA: Diagnosis not present

## 2017-07-28 DIAGNOSIS — S79919A Unspecified injury of unspecified hip, initial encounter: Secondary | ICD-10-CM | POA: Diagnosis not present

## 2017-07-28 DIAGNOSIS — R2681 Unsteadiness on feet: Secondary | ICD-10-CM | POA: Diagnosis not present

## 2017-07-28 DIAGNOSIS — A0472 Enterocolitis due to Clostridium difficile, not specified as recurrent: Secondary | ICD-10-CM | POA: Diagnosis not present

## 2017-07-28 HISTORY — DX: Disorder of kidney and ureter, unspecified: N28.9

## 2017-07-28 HISTORY — DX: Unspecified dementia, unspecified severity, without behavioral disturbance, psychotic disturbance, mood disturbance, and anxiety: F03.90

## 2017-07-28 HISTORY — DX: Essential (primary) hypertension: I10

## 2017-07-28 LAB — CBC WITH DIFFERENTIAL/PLATELET
BASOS ABS: 0 10*3/uL (ref 0.0–0.1)
BASOS PCT: 0 %
Eosinophils Absolute: 0.1 10*3/uL (ref 0.0–0.7)
Eosinophils Relative: 2 %
HEMATOCRIT: 32.6 % — AB (ref 36.0–46.0)
HEMOGLOBIN: 10.6 g/dL — AB (ref 12.0–15.0)
Lymphocytes Relative: 14 %
Lymphs Abs: 1.2 10*3/uL (ref 0.7–4.0)
MCH: 30.5 pg (ref 26.0–34.0)
MCHC: 32.5 g/dL (ref 30.0–36.0)
MCV: 93.9 fL (ref 78.0–100.0)
Monocytes Absolute: 0.6 10*3/uL (ref 0.1–1.0)
Monocytes Relative: 7 %
NEUTROS ABS: 6.9 10*3/uL (ref 1.7–7.7)
Neutrophils Relative %: 77 %
Platelets: 226 10*3/uL (ref 150–400)
RBC: 3.47 MIL/uL — ABNORMAL LOW (ref 3.87–5.11)
RDW: 13.4 % (ref 11.5–15.5)
WBC: 8.9 10*3/uL (ref 4.0–10.5)

## 2017-07-28 LAB — COMPREHENSIVE METABOLIC PANEL
ALBUMIN: 3.7 g/dL (ref 3.5–5.0)
ALK PHOS: 78 U/L (ref 38–126)
ALT: 13 U/L — ABNORMAL LOW (ref 14–54)
ANION GAP: 8 (ref 5–15)
AST: 18 U/L (ref 15–41)
BILIRUBIN TOTAL: 0.6 mg/dL (ref 0.3–1.2)
BUN: 42 mg/dL — ABNORMAL HIGH (ref 6–20)
CHLORIDE: 107 mmol/L (ref 101–111)
CO2: 22 mmol/L (ref 22–32)
Calcium: 8.5 mg/dL — ABNORMAL LOW (ref 8.9–10.3)
Creatinine, Ser: 1.3 mg/dL — ABNORMAL HIGH (ref 0.44–1.00)
GFR, EST AFRICAN AMERICAN: 38 mL/min — AB (ref >60)
GFR, EST NON AFRICAN AMERICAN: 33 mL/min — AB (ref >60)
Glucose, Bld: 114 mg/dL — ABNORMAL HIGH (ref 65–99)
POTASSIUM: 4.4 mmol/L (ref 3.5–5.1)
Sodium: 137 mmol/L (ref 135–145)
Total Protein: 7.2 g/dL (ref 6.5–8.1)

## 2017-07-28 LAB — TYPE AND SCREEN
ABO/RH(D): O POS
ANTIBODY SCREEN: NEGATIVE

## 2017-07-28 LAB — ABO/RH: ABO/RH(D): O POS

## 2017-07-28 MED ORDER — HYDRALAZINE HCL 20 MG/ML IJ SOLN: 10.0000 mg | INTRAMUSCULAR | Status: DC | PRN

## 2017-07-28 MED ORDER — SENNOSIDES-DOCUSATE SODIUM 8.6-50 MG PO TABS: 1.0000 | ORAL_TABLET | Freq: Every evening | ORAL | Status: DC | PRN

## 2017-07-28 MED ORDER — HYDROMORPHONE HCL 1 MG/ML IJ SOLN
0.5000 mg | Freq: Once | INTRAMUSCULAR | Status: AC
  Administered 2017-07-28: 0.5 mg via INTRAVENOUS
  Filled 2017-07-28: qty 1

## 2017-07-28 MED ORDER — HYDROCODONE-ACETAMINOPHEN 5-325 MG PO TABS
1.0000 | ORAL_TABLET | Freq: Four times a day (QID) | ORAL | Status: DC | PRN
  Administered 2017-07-28 – 2017-07-29 (×3): 1 via ORAL
  Filled 2017-07-28 (×4): qty 1

## 2017-07-28 MED ORDER — LABETALOL HCL 5 MG/ML IV SOLN
5.0000 mg | INTRAVENOUS | Status: DC | PRN
  Administered 2017-07-28: 5 mg via INTRAVENOUS
  Filled 2017-07-28 (×2): qty 4

## 2017-07-28 MED ORDER — GALANTAMINE HYDROBROMIDE ER 8 MG PO CP24
8.0000 mg | ORAL_CAPSULE | Freq: Every day | ORAL | Status: DC
  Filled 2017-07-28 (×2): qty 1

## 2017-07-28 MED ORDER — MORPHINE SULFATE (PF) 2 MG/ML IV SOLN: 0.5000 mg | INTRAVENOUS | Status: DC | PRN

## 2017-07-28 MED ORDER — BISACODYL 5 MG PO TBEC: 5.0000 mg | DELAYED_RELEASE_TABLET | Freq: Every day | ORAL | Status: DC | PRN

## 2017-07-28 NOTE — ED Notes (Signed)
ED TO INPATIENT HANDOFF REPORT  Name/Age/Gender Amy Moody 84 y.o. female  Code Status    Code Status Orders  (From admission, onward)        Start     Ordered   07/28/17 2113  Full code  Continuous     07/28/17 2114    Code Status History    Date Active Date Inactive Code Status Order ID Comments User Context   This patient has a current code status but no historical code status.    Advance Directive Documentation     Most Recent Value  Type of Advance Directive  Healthcare Power of Attorney  Pre-existing out of facility DNR order (yellow form or pink MOST form)  No data  "MOST" Form in Place?  No data      Home/SNF/Other Home  Chief Complaint Right Hip Injury  Level of Care/Admitting Diagnosis ED Disposition    ED Disposition Condition Comment   Admit  Hospital Area: [facility] [[id number]]  Level of Care: Med-Surg [16]  Diagnosis: Closed right hip fracture, initial encounter [facility]) [[id number]]  Admitting Physician: Vianne Bulls [[id number]]  Attending Physician: Vianne Bulls [[id number]]  Estimated length of stay: past midnight tomorrow  Certification:: I certify this patient will need inpatient services for at least 2 midnights  PT Class (Do Not Modify): Inpatient [101]  PT Acc Code (Do Not Modify): Private [1]       Medical History Past Medical History:  Diagnosis Date  . Dementia   . Hypertension   . Renal disorder    Chronic Kidney Disease     Allergies Allergies  Allergen Reactions  . Penicillins Other (See Comments)    Child hood    IV Location/Drains/Wounds Patient Lines/Drains/Airways Status   Active Line/Drains/Airways    Name:   Placement date:   Placement time:   Site:   Days:   Peripheral IV 07/28/17 Left Antecubital   07/28/17    1746    Antecubital   less than 1          Labs/Imaging Results for orders placed or performed during the hospital encounter of 07/28/17 (from the past 48 hour(s))  CBC with  Differential/Platelet     Status: Abnormal   Collection Time: 07/28/17  6:22 PM  Result Value Ref Range   WBC 8.9 4.0 - 10.5 K/uL   RBC 3.47 (L) 3.87 - 5.11 MIL/uL   Hemoglobin 10.6 (L) 12.0 - 15.0 g/dL   HCT 32.6 (L) 36.0 - 46.0 %   MCV 93.9 78.0 - 100.0 fL   MCH 30.5 26.0 - 34.0 pg   MCHC 32.5 30.0 - 36.0 g/dL   RDW 13.4 11.5 - 15.5 %   Platelets 226 150 - 400 K/uL   Neutrophils Relative % 77 %   Neutro Abs 6.9 1.7 - 7.7 K/uL   Lymphocytes Relative 14 %   Lymphs Abs 1.2 0.7 - 4.0 K/uL   Monocytes Relative 7 %   Monocytes Absolute 0.6 0.1 - 1.0 K/uL   Eosinophils Relative 2 %   Eosinophils Absolute 0.1 0.0 - 0.7 K/uL   Basophils Relative 0 %   Basophils Absolute 0.0 0.0 - 0.1 K/uL  Comprehensive metabolic panel     Status: Abnormal   Collection Time: 07/28/17  6:22 PM  Result Value Ref Range   Sodium 137 135 - 145 mmol/L   Potassium 4.4 3.5 - 5.1 mmol/L   Chloride 107 101 - 111 mmol/L   CO2  22 22 - 32 mmol/L   Glucose, Bld 114 (H) 65 - 99 mg/dL   BUN 42 (H) 6 - 20 mg/dL   Creatinine, Ser 1.30 (H) 0.44 - 1.00 mg/dL   Calcium 8.5 (L) 8.9 - 10.3 mg/dL   Total Protein 7.2 6.5 - 8.1 g/dL   Albumin 3.7 3.5 - 5.0 g/dL   AST 18 15 - 41 U/L   ALT 13 (L) 14 - 54 U/L   Alkaline Phosphatase 78 38 - 126 U/L   Total Bilirubin 0.6 0.3 - 1.2 mg/dL   GFR calc non Af Amer 33 (L) >60 mL/min   GFR calc Af Amer 38 (L) >60 mL/min    Comment: (NOTE) The eGFR has been calculated using the CKD EPI equation. This calculation has not been validated in all clinical situations. eGFR's persistently <60 mL/min signify possible Chronic Kidney Disease.    Anion gap 8 5 - 15  Type and screen [facility]     Status: None   Collection Time: 07/28/17  6:22 PM  Result Value Ref Range   ABO/RH(D) O POS    Antibody Screen NEG    Sample Expiration 07/31/2017   ABO/Rh     Status: None   Collection Time: 07/28/17  6:22 PM  Result Value Ref Range   ABO/RH(D) Jenetta Downer POS    Dg Chest Port  1 View  Result Date: 07/28/2017 CLINICAL DATA:  Preoperative for hip fracture. EXAM: PORTABLE CHEST 1 VIEW COMPARISON:  08/08/2015 FINDINGS: Shallow inspiration with atelectasis in the lung bases. Cardiac enlargement with pulmonary vascular congestion. Mild interstitial pattern to the lungs likely represents mild interstitial edema superimposed upon mild chronic fibrosis. Emphysematous changes are suggested in the lungs. No focal consolidation. Calcification of the aorta. Degenerative changes in the shoulders. Loss of subacromial space may indicate chronic rotator cuff arthropathy. IMPRESSION: 1. Cardiac enlargement with mild vascular congestion and early interstitial edema. 2. Chronic emphysematous changes and fibrosis in the lungs. 3. Aortic atherosclerosis. Electronically Signed   By: Lucienne Capers M.D.   On: 07/28/2017 20:59   Dg Hip Unilat W Or Wo Pelvis 2-3 Views Right  Result Date: 07/28/2017 CLINICAL DATA:  Patient fell and presents with right hip pain. EXAM: DG HIP (WITH OR WITHOUT PELVIS) 2-3V RIGHT COMPARISON:  None. FINDINGS: Acute, closed, varus angulated basicervical fracture of the right femur without joint dislocation. Intact bony pelvis. Lower lumbar fusion hardware across L4-5. Mild degenerative joint space narrowing both hips. Intact sacroiliac joints and pubic symphysis. No diastasis. Native left hip appears intact. IMPRESSION: Varus angulated fracture at the base of the femoral neck without joint dislocation. Electronically Signed   By: Ashley Royalty M.D.   On: 07/28/2017 18:57    Pending Labs Unresulted Labs (From admission, onward)   Start     Ordered   07/29/17 4970  Basic metabolic panel  Tomorrow morning,   R     07/28/17 2114   07/29/17 0500  CBC  Tomorrow morning,   R     07/28/17 2114   07/28/17 2037  Urinalysis, Routine w reflex microscopic  Once,   R     07/28/17 2037      Vitals/Pain Today's Vitals   07/28/17 1801 07/28/17 1857 07/28/17 2000 07/28/17 2100  BP:   (!) 189/91 (!) 188/86 (!) 183/79  Pulse:  95    Resp:  17    Temp:  98.8 F (37.1 C)    TempSrc:  Oral    SpO2:  99%    Weight: 128 lb (58.1 kg)     Height: 5' 3"  (1.6 m)       Isolation Precautions No active isolations  Medications Medications  HYDROcodone-acetaminophen (NORCO/VICODIN) 5-325 MG per tablet 1-2 tablet (not administered)  morphine 2 MG/ML injection 0.5 mg (not administered)  senna-docusate (Senokot-S) tablet 1 tablet (not administered)  bisacodyl (DULCOLAX) EC tablet 5 mg (not administered)  labetalol (NORMODYNE,TRANDATE) injection 5-10 mg (5 mg Intravenous Given 07/28/17 2221)  HYDROmorphone (DILAUDID) injection 0.5 mg (0.5 mg Intravenous Given 07/28/17 2028)    Mobility walks with device

## 2017-07-28 NOTE — Progress Notes (Signed)
[facility] Pt received to room 1602 via stretcher and transferred to bed without difficulty. Clothing changed and purewick applied. Call bell explained to pt. Family member will stay with pt tonight. Pt in NAD at this time.

## 2017-07-28 NOTE — Progress Notes (Signed)
Patient ID: Amy MelterJuanita L Moody, female   DOB: 17-Dec-1916, 84 y.o.   MRN: [id number] I have reviewed the x-rays on this patient and see that she has a basicervical low femoral neck fracture on the right hip.  Pending medical clearance, I will put her on the [facility] right [facility] fracture.  I will see her first thing in the morning and talk to the family about the risk and benefits of surgery and further assess their wishes.

## 2017-07-28 NOTE — Plan of Care (Signed)
Plan of care initiated.

## 2017-07-28 NOTE — ED Notes (Signed)
Cut patients pants with permission from family members at bedside.

## 2017-07-28 NOTE — ED Triage Notes (Signed)
Patient is from home and experienced a fall while getting into the car. She fell backwards and complaining of hip pain. EMS obtained an IV and administered FENTANYL IV. Per EMS, the hip is shorten and externally rotated.

## 2017-07-28 NOTE — H&P (Signed)
History and Physical    MACKLYN GLANDON ZOX:[id number] DOB: 1916/09/23 DOA: 07/28/2017  PCP: Patient, No Pcp Per   Patient coming from: Home  Chief Complaint: Fall with severe right hip pain   HPI: Amy Moody is a 84 y.o. female with medical history significant for dementia, hypertension, and chronic kidney disease, now presenting to the emergency department with severe right hip pain after a fall at home.  Patient is accompanied by family members who assist with the history.  She had reportedly been in her usual state of health and was having an uneventful day when she was trying to get into a car and stumbled, falling backwards onto her right hip.  She did not hit her head or lose consciousness.  She complained of immediate and severe pain at the right hip and was unable to bear weight.  EMS was called out, she was treated with fentanyl in the field, and transported to the hospital.  He denies any recent illness, fevers, chills, chest pain, cough, or shortness of breath.  ED Course: Upon arrival to the ED, patient is found to be afebrile, saturating well on room air, hypertensive to 190/90, and with vitals otherwise normal.  EKG has been ordered and remains pending.  Chest x-ray is notable for cardiac enlargement with mild vascular congestion and early interstitial edema, as well as chronic emphysematous changes and likely fibrosis.  Radiographs of the right hip demonstrate a varus angulated fracture at the base of the femoral neck without dislocation.  Patient was treated with 0.5 mg of Dilaudid in the ED and orthopedic surgery was consulted.  Consultant recommended a medical admission with the patient to be n.p.o. after midnight for likely surgical repair.  She remained hemodynamically stable, in no apparent respiratory distress, and will be admitted to the medical-surgical unit for ongoing evaluation and management of right hip fracture.  Review of Systems:  All other systems reviewed  and apart from HPI, are negative.  Past Medical History:  Diagnosis Date  . Dementia   . Hypertension   . Renal disorder    Chronic Kidney Disease     Past Surgical History:  Procedure Laterality Date  . BACK SURGERY    . CESAREAN SECTION    . HERNIA REPAIR       reports that she has quit smoking. she has never used smokeless tobacco. She reports that she does not drink alcohol or use drugs.  Allergies not on file  History reviewed. No pertinent family history.   Prior to Admission medications   Not on File    Physical Exam: Vitals:   07/28/17 1801 07/28/17 1857 07/28/17 2000 07/28/17 2100  BP:  (!) 189/91 (!) 188/86 (!) 183/79  Pulse:  95    Resp:  17    Temp:  98.8 F (37.1 C)    TempSrc:  Oral    SpO2:  99%    Weight: 58.1 kg (128 lb)     Height: 5\' 3"  (1.6 m)         Constitutional: NAD, calm, comfortable Eyes: PERTLA, lids and conjunctivae normal ENMT: Mucous membranes are moist. Posterior pharynx clear of any exudate or lesions.   Neck: normal, supple, no masses, no thyromegaly Respiratory: clear to auscultation bilaterally, no wheezing, no crackles. Normal respiratory effort.   Cardiovascular: S1 & S2 heard, regular rate and rhythm. 2+ pedal pulses. No significant JVD. Abdomen: No distension, no tenderness, no masses palpated. Bowel sounds normal.  Musculoskeletal: no clubbing /  cyanosis. Right hip exquisitely tender with intact motor and sensory function, and good cap refill distally.  Skin: no significant rashes, lesions, ulcers. Warm, dry, well-perfused. Neurologic: CN 2-12 grossly intact. Sensation intact. Strength 5/5 in all 4 limbs. Gross hearing deficit.  Psychiatric: Alert, engaged. Pleasant and cooperative.     Labs on Admission: I have personally reviewed following labs and imaging studies  CBC: Recent Labs  Lab 07/28/17 1822  WBC 8.9  NEUTROABS 6.9  HGB 10.6*  HCT 32.6*  MCV 93.9  PLT 226   Basic Metabolic Panel: Recent Labs    Lab 07/28/17 1822  NA 137  K 4.4  CL 107  CO2 22  GLUCOSE 114*  BUN 42*  CREATININE 1.30*  CALCIUM 8.5*   GFR: Estimated Creatinine Clearance: 20 mL/min (A) (by C-G formula based on SCr of 1.3 mg/dL (H)). Liver Function Tests: Recent Labs  Lab 07/28/17 1822  AST 18  ALT 13*  ALKPHOS 78  BILITOT 0.6  PROT 7.2  ALBUMIN 3.7   No results for input(s): LIPASE, AMYLASE in the last 168 hours. No results for input(s): AMMONIA in the last 168 hours. Coagulation Profile: No results for input(s): INR, PROTIME in the last 168 hours. Cardiac Enzymes: No results for input(s): CKTOTAL, CKMB, CKMBINDEX, TROPONINI in the last 168 hours. BNP (last 3 results) No results for input(s): PROBNP in the last 8760 hours. HbA1C: No results for input(s): HGBA1C in the last 72 hours. CBG: No results for input(s): GLUCAP in the last 168 hours. Lipid Profile: No results for input(s): CHOL, HDL, LDLCALC, TRIG, CHOLHDL, LDLDIRECT in the last 72 hours. Thyroid Function Tests: No results for input(s): TSH, T4TOTAL, FREET4, T3FREE, THYROIDAB in the last 72 hours. Anemia Panel: No results for input(s): VITAMINB12, FOLATE, FERRITIN, TIBC, IRON, RETICCTPCT in the last 72 hours. Urine analysis: No results found for: COLORURINE, APPEARANCEUR, LABSPEC, PHURINE, GLUCOSEU, HGBUR, BILIRUBINUR, KETONESUR, PROTEINUR, UROBILINOGEN, NITRITE, LEUKOCYTESUR Sepsis Labs: @LABRCNTIP (procalcitonin:4,lacticidven:4) )No results found for this or any previous visit (from the past 240 hour(s)).   Radiological Exams on Admission: Dg Chest Port 1 View  Result Date: 07/28/2017 CLINICAL DATA:  Preoperative for hip fracture. EXAM: PORTABLE CHEST 1 VIEW COMPARISON:  08/08/2015 FINDINGS: Shallow inspiration with atelectasis in the lung bases. Cardiac enlargement with pulmonary vascular congestion. Mild interstitial pattern to the lungs likely represents mild interstitial edema superimposed upon mild chronic fibrosis.  Emphysematous changes are suggested in the lungs. No focal consolidation. Calcification of the aorta. Degenerative changes in the shoulders. Loss of subacromial space may indicate chronic rotator cuff arthropathy. IMPRESSION: 1. Cardiac enlargement with mild vascular congestion and early interstitial edema. 2. Chronic emphysematous changes and fibrosis in the lungs. 3. Aortic atherosclerosis. Electronically Signed   By: Burman NievesWilliam  Stevens M.D.   On: 07/28/2017 20:59   Dg Hip Unilat W Or Wo Pelvis 2-3 Views Right  Result Date: 07/28/2017 CLINICAL DATA:  Patient fell and presents with right hip pain. EXAM: DG HIP (WITH OR WITHOUT PELVIS) 2-3V RIGHT COMPARISON:  None. FINDINGS: Acute, closed, varus angulated basicervical fracture of the right femur without joint dislocation. Intact bony pelvis. Lower lumbar fusion hardware across L4-5. Mild degenerative joint space narrowing both hips. Intact sacroiliac joints and pubic symphysis. No diastasis. Native left hip appears intact. IMPRESSION: Varus angulated fracture at the base of the femoral neck without joint dislocation. Electronically Signed   By: Tollie Ethavid  Kwon M.D.   On: 07/28/2017 18:57    EKG: Ordered, remains pending.  Assessment/Plan  1. Right hip fracture  -  Presents with severe right hip pain following a ground-level mechanical fall at home  - There was no head-strike or LOC  - Pt was having an uneventful day leading up to the fall and reports that she simply tripped while trying to get into a car  - At baseline, ambulates with a walker without SOB and does not experience angina  - Orthopedic surgery is consulting, request patient be kept NPO  - Based on the available data, Ms. Tenaglia presents an estimated risk-probability of 2% for perioperative MI or cardiac arrest per Nolon Nations al  - Continue prn analgesia, keep NPO after midnight    2. Hypertension  - BP elevated in ED with pain likely contributing  - Hold lisinopril for non-cardiac  surgery  - Use prn labetalol IVP's    3. Renal insufficiency  - SCr is 1.30, up from 1.1 in 2017  - Appears euvolemic to slightly hypervolemic on admission  - Holding lisinopril as above  - Renally-dose medications  - Repeat chem panel in am    4. Normocytic anemia  - Hgb is 10.6 on admission, similar to remote prior  - Type and screen is done, no bleeding evident    5. Dementia  - Stable, continue Razadyne     DVT prophylaxis: SCD's  Code Status: Full  Family Communication: Family updated at bedside Disposition Plan: Admit to med-surg Consults called: Orthopedic surgery Admission status: Inpatient    Briscoe Deutscher, MD [facility] Pager [phone]  If 7PM-7AM, please contact night-coverage [email or web address] Password [email or web address]  07/28/2017, 9:15 PM

## 2017-07-28 NOTE — ED Provider Notes (Signed)
[facility] Provider Note   CSN: [id number] Arrival date & time: 07/28/17  1712     History   Chief Complaint Chief Complaint  Patient presents with  . Fall  . Hip Pain    HPI Amy Moody is a 84 y.o. female.  Patient fell today and hurt her right hip.  Patient was with her caregiver.    Fall  This is a new problem. The current episode started 3 to 5 hours ago. The problem occurs constantly. The problem has not changed since onset.Pertinent negatives include no chest pain, no abdominal pain and no headaches. Exacerbated by: Movement. Nothing relieves the symptoms. She has tried nothing for the symptoms. The treatment provided no relief.  Hip Pain  Pertinent negatives include no chest pain, no abdominal pain and no headaches.    Past Medical History:  Diagnosis Date  . Dementia   . Hypertension   . Renal disorder    Chronic Kidney Disease     Patient Active Problem List   Diagnosis Date Noted  . Dementia 07/28/2017  . Hypertension 07/28/2017  . Renal disorder 07/28/2017  . Closed right hip fracture, initial encounter (HCC) 07/28/2017  . Normocytic anemia 07/28/2017    Past Surgical History:  Procedure Laterality Date  . BACK SURGERY    . CESAREAN SECTION    . HERNIA REPAIR      OB History    No data available       Home Medications    Prior to Admission medications   Not on File    Family History History reviewed. No pertinent family history.  Social History Social History   Tobacco Use  . Smoking status: Former Games developermoker  . Smokeless tobacco: Never Used  Substance Use Topics  . Alcohol use: No    Frequency: Never  . Drug use: No     Allergies   Patient has no allergy information on record.   Review of Systems Review of Systems  Constitutional: Negative for appetite change and fatigue.  HENT: Negative for congestion, ear discharge and sinus pressure.   Eyes: Negative for discharge.    Respiratory: Negative for cough.   Cardiovascular: Negative for chest pain.  Gastrointestinal: Negative for abdominal pain and diarrhea.  Genitourinary: Negative for frequency and hematuria.  Musculoskeletal: Negative for back pain.       Hip pain  Skin: Negative for rash.  Neurological: Negative for seizures and headaches.  Psychiatric/Behavioral: Negative for hallucinations.     Physical Exam Updated Vital Signs BP (!) 189/91 (BP Location: Right Arm)   Pulse 95   Temp 98.8 F (37.1 C) (Oral)   Resp 17   Ht 5\' 3"  (1.6 m)   Wt 58.1 kg (128 lb)   SpO2 99%   BMI 22.67 kg/m   Physical Exam  Constitutional: She appears well-developed.  HENT:  Head: Normocephalic.  Eyes: Conjunctivae and EOM are normal. No scleral icterus.  Neck: Neck supple. No thyromegaly present.  Cardiovascular: Normal rate and regular rhythm. Exam reveals no gallop and no friction rub.  No murmur heard. Pulmonary/Chest: No stridor. She has no wheezes. She has no rales. She exhibits no tenderness.  Abdominal: She exhibits no distension. There is no tenderness. There is no rebound.  Musculoskeletal: She exhibits no edema.  Tender right hip  Lymphadenopathy:    She has no cervical adenopathy.  Neurological: She is alert. She exhibits normal muscle tone. Coordination normal.  Skin: No rash noted. No erythema.  ED Treatments / Results  Labs (all labs ordered are listed, but only abnormal results are displayed) Labs Reviewed  CBC WITH DIFFERENTIAL/PLATELET - Abnormal; Notable for the following components:      Result Value   RBC 3.47 (*)    Hemoglobin 10.6 (*)    HCT 32.6 (*)    All other components within normal limits  COMPREHENSIVE METABOLIC PANEL - Abnormal; Notable for the following components:   Glucose, Bld 114 (*)    BUN 42 (*)    Creatinine, Ser 1.30 (*)    Calcium 8.5 (*)    ALT 13 (*)    GFR calc non Af Amer 33 (*)    GFR calc Af Amer 38 (*)    All other components within normal  limits  URINALYSIS, ROUTINE W REFLEX MICROSCOPIC  TYPE AND SCREEN  ABO/RH    EKG  EKG Interpretation None       Radiology Dg Chest Port 1 View  Result Date: 07/28/2017 CLINICAL DATA:  Preoperative for hip fracture. EXAM: PORTABLE CHEST 1 VIEW COMPARISON:  08/08/2015 FINDINGS: Shallow inspiration with atelectasis in the lung bases. Cardiac enlargement with pulmonary vascular congestion. Mild interstitial pattern to the lungs likely represents mild interstitial edema superimposed upon mild chronic fibrosis. Emphysematous changes are suggested in the lungs. No focal consolidation. Calcification of the aorta. Degenerative changes in the shoulders. Loss of subacromial space may indicate chronic rotator cuff arthropathy. IMPRESSION: 1. Cardiac enlargement with mild vascular congestion and early interstitial edema. 2. Chronic emphysematous changes and fibrosis in the lungs. 3. Aortic atherosclerosis. Electronically Signed   By: Burman Nieves M.D.   On: 07/28/2017 20:59   Dg Hip Unilat W Or Wo Pelvis 2-3 Views Right  Result Date: 07/28/2017 CLINICAL DATA:  Patient fell and presents with right hip pain. EXAM: DG HIP (WITH OR WITHOUT PELVIS) 2-3V RIGHT COMPARISON:  None. FINDINGS: Acute, closed, varus angulated basicervical fracture of the right femur without joint dislocation. Intact bony pelvis. Lower lumbar fusion hardware across L4-5. Mild degenerative joint space narrowing both hips. Intact sacroiliac joints and pubic symphysis. No diastasis. Native left hip appears intact. IMPRESSION: Varus angulated fracture at the base of the femoral neck without joint dislocation. Electronically Signed   By: Tollie Eth M.D.   On: 07/28/2017 18:57    Procedures Procedures (including critical care time)  Medications Ordered in ED Medications  hydrALAZINE (APRESOLINE) injection 10 mg (not administered)  HYDROmorphone (DILAUDID) injection 0.5 mg (0.5 mg Intravenous Given 07/28/17 2028)     Initial  Impression / Assessment and Plan / ED Course  I have reviewed the triage vital signs and the nursing notes.  Pertinent labs & imaging results that were available during my care of the patient were reviewed by me and considered in my medical decision making (see chart for details).     Patient with a right hip fracture.  She will be admitted to medicine and orthopedics will do surgery tomorrow  Final Clinical Impressions(s) / ED Diagnoses   Final diagnoses:  Right hip pain    ED Discharge Orders    None       Bethann Berkshire, MD 07/28/17 2114

## 2017-07-29 ENCOUNTER — Inpatient Hospital Stay (HOSPITAL_COMMUNITY): Payer: Medicare Other | Admitting: Certified Registered Nurse Anesthetist

## 2017-07-29 ENCOUNTER — Encounter (HOSPITAL_COMMUNITY): Payer: Self-pay | Admitting: Certified Registered Nurse Anesthetist

## 2017-07-29 ENCOUNTER — Inpatient Hospital Stay (HOSPITAL_COMMUNITY): Payer: Medicare Other

## 2017-07-29 ENCOUNTER — Encounter (HOSPITAL_COMMUNITY)
Admission: EM | Disposition: A | Discharge status: Skilled Nursing Facility | Payer: Self-pay | Source: Home / Self Care | Attending: Family Medicine

## 2017-07-29 DIAGNOSIS — S72001A Fracture of unspecified part of neck of right femur, initial encounter for closed fracture: Principal | ICD-10-CM

## 2017-07-29 HISTORY — PX: INTRAMEDULLARY (IM) NAIL INTERTROCHANTERIC: SHX5875

## 2017-07-29 LAB — BASIC METABOLIC PANEL
Anion gap: 6 (ref 5–15)
BUN: 39 mg/dL — AB (ref 6–20)
CALCIUM: 8.6 mg/dL — AB (ref 8.9–10.3)
CO2: 24 mmol/L (ref 22–32)
CREATININE: 1.21 mg/dL — AB (ref 0.44–1.00)
Chloride: 106 mmol/L (ref 101–111)
GFR calc Af Amer: 42 mL/min — ABNORMAL LOW (ref >60)
GFR calc non Af Amer: 36 mL/min — ABNORMAL LOW (ref >60)
GLUCOSE: 151 mg/dL — AB (ref 65–99)
Potassium: 4.5 mmol/L (ref 3.5–5.1)
Sodium: 136 mmol/L (ref 135–145)

## 2017-07-29 LAB — CBC
HCT: 28.8 % — ABNORMAL LOW (ref 36.0–46.0)
Hemoglobin: 9.5 g/dL — ABNORMAL LOW (ref 12.0–15.0)
MCH: 30.9 pg (ref 26.0–34.0)
MCHC: 33 g/dL (ref 30.0–36.0)
MCV: 93.8 fL (ref 78.0–100.0)
PLATELETS: 204 10*3/uL (ref 150–400)
RBC: 3.07 MIL/uL — ABNORMAL LOW (ref 3.87–5.11)
RDW: 13.4 % (ref 11.5–15.5)
WBC: 8.7 10*3/uL (ref 4.0–10.5)

## 2017-07-29 LAB — MRSA PCR SCREENING: MRSA BY PCR: NEGATIVE

## 2017-07-29 SURGERY — FIXATION, FRACTURE, INTERTROCHANTERIC, WITH INTRAMEDULLARY ROD
Anesthesia: General | Site: Hip | Laterality: Right

## 2017-07-29 MED ORDER — ONDANSETRON HCL 4 MG/2ML IJ SOLN: 4.0000 mg | Freq: Four times a day (QID) | INTRAMUSCULAR | Status: DC | PRN

## 2017-07-29 MED ORDER — SUGAMMADEX SODIUM 200 MG/2ML IV SOLN
INTRAVENOUS | Status: DC | PRN
  Administered 2017-07-29: 150 mg via INTRAVENOUS

## 2017-07-29 MED ORDER — PROPOFOL 10 MG/ML IV BOLUS
INTRAVENOUS | Status: DC | PRN
  Administered 2017-07-29: 80 mg via INTRAVENOUS

## 2017-07-29 MED ORDER — CEFAZOLIN SODIUM-DEXTROSE 2-3 GM-%(50ML) IV SOLR
INTRAVENOUS | Status: DC | PRN
  Administered 2017-07-29: 2 g via INTRAVENOUS

## 2017-07-29 MED ORDER — SODIUM CHLORIDE 0.9 % IV SOLN
INTRAVENOUS | Status: DC
  Administered 2017-07-30 – 2017-07-31 (×2): via INTRAVENOUS

## 2017-07-29 MED ORDER — CEFAZOLIN SODIUM-DEXTROSE 2-4 GM/100ML-% IV SOLN: 2.0000 g | Freq: Once | INTRAVENOUS | Status: DC

## 2017-07-29 MED ORDER — ASPIRIN EC 325 MG PO TBEC
325.0000 mg | DELAYED_RELEASE_TABLET | Freq: Every day | ORAL | Status: DC
  Administered 2017-07-30 – 2017-08-02 (×4): 325 mg via ORAL
  Filled 2017-07-29 (×4): qty 1

## 2017-07-29 MED ORDER — FENTANYL CITRATE (PF) 100 MCG/2ML IJ SOLN
INTRAMUSCULAR | Status: AC
  Filled 2017-07-29: qty 2

## 2017-07-29 MED ORDER — HYDROCODONE-ACETAMINOPHEN 5-325 MG PO TABS
1.0000 | ORAL_TABLET | Freq: Four times a day (QID) | ORAL | Status: DC | PRN
  Administered 2017-07-30 – 2017-08-02 (×7): 1 via ORAL
  Filled 2017-07-29 (×8): qty 1

## 2017-07-29 MED ORDER — ACETAMINOPHEN 325 MG PO TABS
650.0000 mg | ORAL_TABLET | Freq: Four times a day (QID) | ORAL | Status: DC | PRN
  Administered 2017-07-31 – 2017-08-01 (×3): 650 mg via ORAL
  Filled 2017-07-29 (×3): qty 2

## 2017-07-29 MED ORDER — MENTHOL 3 MG MT LOZG: 1.0000 | LOZENGE | OROMUCOSAL | Status: DC | PRN

## 2017-07-29 MED ORDER — DEXAMETHASONE SODIUM PHOSPHATE 10 MG/ML IJ SOLN
INTRAMUSCULAR | Status: DC | PRN
  Administered 2017-07-29: 5 mg via INTRAVENOUS

## 2017-07-29 MED ORDER — OXYCODONE HCL 5 MG PO TABS: 5.0000 mg | ORAL_TABLET | Freq: Once | ORAL | Status: DC | PRN

## 2017-07-29 MED ORDER — MORPHINE SULFATE (PF) 2 MG/ML IV SOLN
0.5000 mg | INTRAVENOUS | Status: DC | PRN
  Administered 2017-07-30 – 2017-08-01 (×3): 0.5 mg via INTRAVENOUS
  Filled 2017-07-29 (×3): qty 1

## 2017-07-29 MED ORDER — FENTANYL CITRATE (PF) 100 MCG/2ML IJ SOLN
INTRAMUSCULAR | Status: DC | PRN
  Administered 2017-07-29 (×5): 50 ug via INTRAVENOUS

## 2017-07-29 MED ORDER — ONDANSETRON HCL 4 MG PO TABS: 4.0000 mg | ORAL_TABLET | Freq: Four times a day (QID) | ORAL | Status: DC | PRN

## 2017-07-29 MED ORDER — FENTANYL CITRATE (PF) 100 MCG/2ML IJ SOLN
25.0000 ug | INTRAMUSCULAR | Status: DC | PRN
  Administered 2017-07-29 (×3): 25 ug via INTRAVENOUS

## 2017-07-29 MED ORDER — SODIUM CHLORIDE 0.9 % IR SOLN
Status: DC | PRN
  Administered 2017-07-29: 1000 mL

## 2017-07-29 MED ORDER — LIDOCAINE 2% (20 MG/ML) 5 ML SYRINGE
INTRAMUSCULAR | Status: DC | PRN
  Administered 2017-07-29: 60 mg via INTRAVENOUS

## 2017-07-29 MED ORDER — PHENYLEPHRINE 40 MCG/ML (10ML) SYRINGE FOR IV PUSH (FOR BLOOD PRESSURE SUPPORT)
PREFILLED_SYRINGE | INTRAVENOUS | Status: AC
  Filled 2017-07-29: qty 30

## 2017-07-29 MED ORDER — GALANTAMINE HYDROBROMIDE 4 MG PO TABS
4.0000 mg | ORAL_TABLET | Freq: Two times a day (BID) | ORAL | Status: DC
  Administered 2017-07-30 – 2017-08-02 (×7): 4 mg via ORAL
  Filled 2017-07-29 (×10): qty 1

## 2017-07-29 MED ORDER — PHENOL 1.4 % MT LIQD
1.0000 | OROMUCOSAL | Status: DC | PRN
  Filled 2017-07-29: qty 177

## 2017-07-29 MED ORDER — ACETAMINOPHEN 650 MG RE SUPP: 650.0000 mg | Freq: Four times a day (QID) | RECTAL | Status: DC | PRN

## 2017-07-29 MED ORDER — ONDANSETRON HCL 4 MG/2ML IJ SOLN
INTRAMUSCULAR | Status: AC
  Filled 2017-07-29: qty 2

## 2017-07-29 MED ORDER — HYDRALAZINE HCL 20 MG/ML IJ SOLN
10.0000 mg | Freq: Four times a day (QID) | INTRAMUSCULAR | Status: DC | PRN
  Administered 2017-08-01 – 2017-08-02 (×2): 10 mg via INTRAVENOUS
  Filled 2017-07-29 (×2): qty 1

## 2017-07-29 MED ORDER — METOCLOPRAMIDE HCL 5 MG/ML IJ SOLN: 5.0000 mg | Freq: Three times a day (TID) | INTRAMUSCULAR | Status: DC | PRN

## 2017-07-29 MED ORDER — CLINDAMYCIN PHOSPHATE 600 MG/50ML IV SOLN
600.0000 mg | Freq: Four times a day (QID) | INTRAVENOUS | Status: AC
  Administered 2017-07-29 – 2017-07-30 (×2): 600 mg via INTRAVENOUS
  Filled 2017-07-29 (×2): qty 50

## 2017-07-29 MED ORDER — PHENYLEPHRINE 40 MCG/ML (10ML) SYRINGE FOR IV PUSH (FOR BLOOD PRESSURE SUPPORT)
PREFILLED_SYRINGE | INTRAVENOUS | Status: DC | PRN
  Administered 2017-07-29 (×3): 80 ug via INTRAVENOUS

## 2017-07-29 MED ORDER — PROPOFOL 10 MG/ML IV BOLUS
INTRAVENOUS | Status: AC
  Filled 2017-07-29: qty 20

## 2017-07-29 MED ORDER — ROCURONIUM BROMIDE 50 MG/5ML IV SOSY
PREFILLED_SYRINGE | INTRAVENOUS | Status: DC | PRN
  Administered 2017-07-29: 40 mg via INTRAVENOUS
  Administered 2017-07-29: 10 mg via INTRAVENOUS

## 2017-07-29 MED ORDER — LIDOCAINE 2% (20 MG/ML) 5 ML SYRINGE
INTRAMUSCULAR | Status: AC
  Filled 2017-07-29: qty 5

## 2017-07-29 MED ORDER — METOCLOPRAMIDE HCL 5 MG PO TABS: 5.0000 mg | ORAL_TABLET | Freq: Three times a day (TID) | ORAL | Status: DC | PRN

## 2017-07-29 MED ORDER — CEFAZOLIN SODIUM-DEXTROSE 2-4 GM/100ML-% IV SOLN
INTRAVENOUS | Status: AC
  Filled 2017-07-29: qty 100

## 2017-07-29 MED ORDER — DEXAMETHASONE SODIUM PHOSPHATE 10 MG/ML IJ SOLN
INTRAMUSCULAR | Status: AC
  Filled 2017-07-29: qty 1

## 2017-07-29 MED ORDER — LACTATED RINGERS IV SOLN
INTRAVENOUS | Status: DC
  Administered 2017-07-29 (×4): via INTRAVENOUS

## 2017-07-29 MED ORDER — LIP MEDEX EX OINT
TOPICAL_OINTMENT | CUTANEOUS | Status: AC
  Administered 2017-07-29: 06:00:00
  Filled 2017-07-29: qty 7

## 2017-07-29 MED ORDER — ONDANSETRON HCL 4 MG/2ML IJ SOLN
INTRAMUSCULAR | Status: DC | PRN
  Administered 2017-07-29: 4 mg via INTRAVENOUS

## 2017-07-29 MED ORDER — OXYCODONE HCL 5 MG/5ML PO SOLN
5.0000 mg | Freq: Once | ORAL | Status: DC | PRN
  Filled 2017-07-29: qty 5

## 2017-07-29 MED ORDER — FENTANYL CITRATE (PF) 250 MCG/5ML IJ SOLN
INTRAMUSCULAR | Status: AC
  Filled 2017-07-29: qty 5

## 2017-07-29 SURGICAL SUPPLY — 31 items
BNDG GAUZE ELAST 4 BULKY (GAUZE/BANDAGES/DRESSINGS) ×3 IMPLANT
COVER PERINEAL POST (MISCELLANEOUS) ×3 IMPLANT
COVER SURGICAL LIGHT HANDLE (MISCELLANEOUS) ×3 IMPLANT
DRAPE STERI IOBAN 125X83 (DRAPES) ×3 IMPLANT
DRSG MEPILEX BORDER 4X4 (GAUZE/BANDAGES/DRESSINGS) ×6 IMPLANT
DRSG MEPILEX BORDER 4X8 (GAUZE/BANDAGES/DRESSINGS) IMPLANT
DURAPREP 26ML APPLICATOR (WOUND CARE) ×3 IMPLANT
ELECT REM PT RETURN 15FT ADLT (MISCELLANEOUS) ×3 IMPLANT
GAUZE XEROFORM 1X8 LF (GAUZE/BANDAGES/DRESSINGS) ×2 IMPLANT
GAUZE XEROFORM 5X9 LF (GAUZE/BANDAGES/DRESSINGS) ×3 IMPLANT
GLOVE BIO SURGEON STRL SZ7.5 (GLOVE) ×3 IMPLANT
GLOVE BIOGEL PI IND STRL 8 (GLOVE) ×1 IMPLANT
GLOVE BIOGEL PI INDICATOR 8 (GLOVE) ×2
GLOVE ECLIPSE 8.0 STRL XLNG CF (GLOVE) ×2 IMPLANT
GOWN STRL REUS W/TWL XL LVL3 (GOWN DISPOSABLE) ×5 IMPLANT
GUIDE PIN 3.2X343 (PIN) ×2
GUIDE PIN 3.2X343MM (PIN) ×6
KIT BASIN OR (CUSTOM PROCEDURE TRAY) ×3 IMPLANT
MANIFOLD NEPTUNE II (INSTRUMENTS) ×3 IMPLANT
NAIL TRIGEN 10MMX36CM-125 RT (Nail) ×2 IMPLANT
PACK GENERAL/GYN (CUSTOM PROCEDURE TRAY) ×3 IMPLANT
PIN GUIDE 3.2X343MM (PIN) IMPLANT
POSITIONER SURGICAL ARM (MISCELLANEOUS) ×3 IMPLANT
SCREW LAG COMPR KIT 100/95 (Screw) ×2 IMPLANT
STAPLER VISISTAT 35W (STAPLE) ×3 IMPLANT
SUT VIC AB 0 CT1 36 (SUTURE) ×3 IMPLANT
SUT VIC AB 1 CT1 36 (SUTURE) ×3 IMPLANT
SUT VIC AB 2-0 CT1 27 (SUTURE) ×3
SUT VIC AB 2-0 CT1 TAPERPNT 27 (SUTURE) ×1 IMPLANT
TOWEL OR 17X26 10 PK STRL BLUE (TOWEL DISPOSABLE) ×3 IMPLANT
TRAY FOLEY CATH 14FRSI W/METER (CATHETERS) ×2 IMPLANT

## 2017-07-29 NOTE — Progress Notes (Signed)
Patient Demographics:    Amy Moody, is a 84 y.o. female, DOB - 11/15/1916, ZOX:[id number]  Admit date - 07/28/2017   Admitting Physician Briscoe Deutscher, MD  Outpatient Primary MD for the patient is Patient, No Pcp Per  LOS - 1   Chief Complaint  Patient presents with  . Fall  . Hip Pain        Subjective:    Lenard Forth today has no fevers, no emesis,  No chest pain, family members at bedside, questions answered  Assessment  & Plan :    Principal Problem:   Closed right hip fracture, initial encounter (HCC) Active Problems:   Dementia   Hypertension   Renal disorder   Normocytic anemia  1) Right hip fracture - ORIF per Dr Magnus Ivan - Presented with severe right hip pain following a ground-level mechanical fall at home , reports that she simply tripped while trying to get into a car , No Head injury or LOC   2) Hypertension -lisinopril on hold due to [facility], may use IV hydralazine as needed for elevated blood pressure  3)AKI-creatinine down to 1.2 from 1.3 on admission, baseline creatinine around 1.1, lisinopril on hold.   4)Chronic Normocytic anemia -baseline hemoglobin usually around 11, hemoglobin  is down to 9.5, anticipate further drop in H&H postop.  No evidence of ongoing/acute bleeding at this time  5)Dementia - Stable, continue Razadyne     DVT prophylaxis: SCD's  Code Status: Full  Family Communication: Family updated at bedside Disposition Plan: Admit to med-surg Consults called: Orthopedic surgery Admission status: Inpatient    Lab Results  Component Value Date   PLT 204 07/29/2017    Inpatient Medications  Scheduled Meds: . [MAR Hold] galantamine  4 mg Oral BID WC   Continuous Infusions: . ceFAZolin    .  ceFAZolin (ANCEF) IV    . lactated ringers 50 mL/hr at 07/29/17 1521   PRN Meds:.[MAR Hold] bisacodyl, hydrALAZINE, [MAR Hold]  HYDROcodone-acetaminophen, [MAR Hold] labetalol, [MAR Hold]  morphine injection, [MAR Hold] senna-docusate    Anti-infectives (From admission, onward)   Start     Dose/Rate Route Frequency Ordered Stop   07/29/17 1615  ceFAZolin (ANCEF) IVPB 2g/100 mL premix     2 g 200 mL/hr over 30 Minutes Intravenous  Once 07/29/17 1408     07/29/17 1417  ceFAZolin (ANCEF) 2-4 GM/100ML-% IVPB    Comments:  Wylene Simmer   : cabinet override      07/29/17 1417 07/30/17 0229        Objective:   Vitals:   07/28/17 2100 07/28/17 2257 07/29/17 0449 07/29/17 1422  BP: (!) 183/79 (!) 195/74 (!) 155/72 (!) 156/80  Pulse:  91 81 80  Resp:  18 17 15   Temp:  98.4 F (36.9 C) 97.9 F (36.6 C) 97.6 F (36.4 C)  TempSrc:  Oral Oral Oral  SpO2:  96% 96% 98%  Weight:      Height:        Wt Readings from Last 3 Encounters:  07/28/17 58.1 kg (128 lb)     Intake/Output Summary (Last 24 hours) at 07/29/2017 1526 Last data filed at 07/29/2017 1003 Gross per 24 hour  Intake 0 ml  Output 0 ml  Net 0 ml    Physical Exam  Gen:- Awake Alert,  In no apparent distress  HEENT:- [place].AT, No sclera icterus. HOH Neck-Supple Neck,No JVD,.  Lungs-  CTAB  CV- S1, S2 normal, 4/6 SM Abd-  +ve B.Sounds, Abd Soft, No tenderness,    Extremity/Skin:- No  edema,   Tenderness over Rt Hip, Rt LE shortened and rotated Psych-significant cognitive deficits but no significant behavioral problems Neuro-no new focal neuro deficits    Data Review:   Micro Results Recent Results (from the past 240 hour(s))  MRSA PCR Screening     Status: None   Collection Time: 07/29/17  4:06 AM  Result Value Ref Range Status   MRSA by PCR NEGATIVE NEGATIVE Final    Comment:        The GeneXpert MRSA Assay (FDA approved for NASAL specimens only), is one component of a comprehensive MRSA colonization surveillance program. It is not intended to diagnose MRSA infection nor to guide or monitor treatment for MRSA infections.      Radiology Reports Dg Chest Port 1 View  Result Date: 07/28/2017 CLINICAL DATA:  Preoperative for hip fracture. EXAM: PORTABLE CHEST 1 VIEW COMPARISON:  08/08/2015 FINDINGS: Shallow inspiration with atelectasis in the lung bases. Cardiac enlargement with pulmonary vascular congestion. Mild interstitial pattern to the lungs likely represents mild interstitial edema superimposed upon mild chronic fibrosis. Emphysematous changes are suggested in the lungs. No focal consolidation. Calcification of the aorta. Degenerative changes in the shoulders. Loss of subacromial space may indicate chronic rotator cuff arthropathy. IMPRESSION: 1. Cardiac enlargement with mild vascular congestion and early interstitial edema. 2. Chronic emphysematous changes and fibrosis in the lungs. 3. Aortic atherosclerosis. Electronically Signed   By: Burman NievesWilliam  Stevens M.D.   On: 07/28/2017 20:59   Dg Hip Unilat W Or Wo Pelvis 2-3 Views Right  Result Date: 07/28/2017 CLINICAL DATA:  Patient fell and presents with right hip pain. EXAM: DG HIP (WITH OR WITHOUT PELVIS) 2-3V RIGHT COMPARISON:  None. FINDINGS: Acute, closed, varus angulated basicervical fracture of the right femur without joint dislocation. Intact bony pelvis. Lower lumbar fusion hardware across L4-5. Mild degenerative joint space narrowing both hips. Intact sacroiliac joints and pubic symphysis. No diastasis. Native left hip appears intact. IMPRESSION: Varus angulated fracture at the base of the femoral neck without joint dislocation. Electronically Signed   By: Tollie Ethavid  Kwon M.D.   On: 07/28/2017 18:57     CBC Recent Labs  Lab 07/28/17 1822 07/29/17 0515  WBC 8.9 8.7  HGB 10.6* 9.5*  HCT 32.6* 28.8*  PLT 226 204  MCV 93.9 93.8  MCH 30.5 30.9  MCHC 32.5 33.0  RDW 13.4 13.4  LYMPHSABS 1.2  --   MONOABS 0.6  --   EOSABS 0.1  --   BASOSABS 0.0  --     Chemistries  Recent Labs  Lab 07/28/17 1822 07/29/17 0515  NA 137 136  K 4.4 4.5  CL 107 106  CO2 22  24  GLUCOSE 114* 151*  BUN 42* 39*  CREATININE 1.30* 1.21*  CALCIUM 8.5* 8.6*  AST 18  --   ALT 13*  --   ALKPHOS 78  --   BILITOT 0.6  --    ------------------------------------------------------------------------------------------------------------------ No results for input(s): CHOL, HDL, LDLCALC, TRIG, CHOLHDL, LDLDIRECT in the last 72 hours.  No results found for: HGBA1C ------------------------------------------------------------------------------------------------------------------ No results for input(s): TSH, T4TOTAL, T3FREE, THYROIDAB in the last 72 hours.  Invalid input(s): FREET3 ------------------------------------------------------------------------------------------------------------------ No results for input(s): VITAMINB12, FOLATE,  FERRITIN, TIBC, IRON, RETICCTPCT in the last 72 hours.  Coagulation profile No results for input(s): INR, PROTIME in the last 168 hours.  No results for input(s): DDIMER in the last 72 hours.  Cardiac Enzymes No results for input(s): CKMB, TROPONINI, MYOGLOBIN in the last 168 hours.  Invalid input(s): CK ------------------------------------------------------------------------------------------------------------------ No results found for: BNP   Shon Hale M.D on 07/29/2017 at 3:26 PM  Between 7am to 7pm - Pager - [phone]  After 7pm go to [email or web address] - password [email or web address]  [facility] -  Office  [phone]   Voice Recognition Reubin Milan dictation system was used to create this note, attempts have been made to correct errors. Please contact the author with questions and/or clarifications.

## 2017-07-29 NOTE — Consult Note (Signed)
Reason for Consult:  Right hip fracture Referring Physician:   Milton Ferguson, MD/EDP  Amy Moody is an 84 y.o. female.  HPI:   84 yo female who sustained an accidental mechanical fall yesterday evening.  Was brought to the [facility] ED and x-rays confirmed a right hip fracture.  Both Ortho and Medicine were consulted.  The family is with her.  She does report significant right hip pain and is quite uncomfortable.  Past Medical History:  Diagnosis Date  . Dementia   . Hypertension   . Renal disorder    Chronic Kidney Disease     Past Surgical History:  Procedure Laterality Date  . BACK SURGERY    . CESAREAN SECTION    . HERNIA REPAIR      History reviewed. No pertinent family history.  Social History:  reports that she has quit smoking. she has never used smokeless tobacco. She reports that she does not drink alcohol or use drugs.  Allergies:  Allergies  Allergen Reactions  . Penicillins Other (See Comments)    Has patient had a PCN reaction causing immediate rash, facial/tongue/throat swelling, SOB or lightheadedness with hypotension: Unknown Has patient had a PCN reaction causing severe rash involving mucus membranes or skin necrosis: Unknown Has patient had a PCN reaction that required hospitalization: unknown Has patient had a PCN reaction occurring within the last 10 years: No If all of the above answers are "NO", then may proceed with Cephalosporin use.     Medications: I have reviewed the patient's current medications.  Results for orders placed or performed during the hospital encounter of 07/28/17 (from the past 48 hour(s))  CBC with Differential/Platelet     Status: Abnormal   Collection Time: 07/28/17  6:22 PM  Result Value Ref Range   WBC 8.9 4.0 - 10.5 K/uL   RBC 3.47 (L) 3.87 - 5.11 MIL/uL   Hemoglobin 10.6 (L) 12.0 - 15.0 g/dL   HCT 32.6 (L) 36.0 - 46.0 %   MCV 93.9 78.0 - 100.0 fL   MCH 30.5 26.0 - 34.0 pg   MCHC 32.5 30.0 - 36.0 g/dL   RDW  13.4 11.5 - 15.5 %   Platelets 226 150 - 400 K/uL   Neutrophils Relative % 77 %   Neutro Abs 6.9 1.7 - 7.7 K/uL   Lymphocytes Relative 14 %   Lymphs Abs 1.2 0.7 - 4.0 K/uL   Monocytes Relative 7 %   Monocytes Absolute 0.6 0.1 - 1.0 K/uL   Eosinophils Relative 2 %   Eosinophils Absolute 0.1 0.0 - 0.7 K/uL   Basophils Relative 0 %   Basophils Absolute 0.0 0.0 - 0.1 K/uL  Comprehensive metabolic panel     Status: Abnormal   Collection Time: 07/28/17  6:22 PM  Result Value Ref Range   Sodium 137 135 - 145 mmol/L   Potassium 4.4 3.5 - 5.1 mmol/L   Chloride 107 101 - 111 mmol/L   CO2 22 22 - 32 mmol/L   Glucose, Bld 114 (H) 65 - 99 mg/dL   BUN 42 (H) 6 - 20 mg/dL   Creatinine, Ser 1.30 (H) 0.44 - 1.00 mg/dL   Calcium 8.5 (L) 8.9 - 10.3 mg/dL   Total Protein 7.2 6.5 - 8.1 g/dL   Albumin 3.7 3.5 - 5.0 g/dL   AST 18 15 - 41 U/L   ALT 13 (L) 14 - 54 U/L   Alkaline Phosphatase 78 38 - 126 U/L   Total Bilirubin 0.6  0.3 - 1.2 mg/dL   GFR calc non Af Amer 33 (L) >60 mL/min   GFR calc Af Amer 38 (L) >60 mL/min    Comment: (NOTE) The eGFR has been calculated using the CKD EPI equation. This calculation has not been validated in all clinical situations. eGFR's persistently <60 mL/min signify possible Chronic Kidney Disease.    Anion gap 8 5 - 15  Type and screen [place]     Status: None   Collection Time: 07/28/17  6:22 PM  Result Value Ref Range   ABO/RH(D) O POS    Antibody Screen NEG    Sample Expiration 07/31/2017   ABO/Rh     Status: None   Collection Time: 07/28/17  6:22 PM  Result Value Ref Range   ABO/RH(D) O POS   Basic metabolic panel     Status: Abnormal   Collection Time: 07/29/17  5:15 AM  Result Value Ref Range   Sodium 136 135 - 145 mmol/L   Potassium 4.5 3.5 - 5.1 mmol/L   Chloride 106 101 - 111 mmol/L   CO2 24 22 - 32 mmol/L   Glucose, Bld 151 (H) 65 - 99 mg/dL   BUN 39 (H) 6 - 20 mg/dL   Creatinine, Ser 1.21 (H) 0.44 - 1.00 mg/dL    Calcium 8.6 (L) 8.9 - 10.3 mg/dL   GFR calc non Af Amer 36 (L) >60 mL/min   GFR calc Af Amer 42 (L) >60 mL/min    Comment: (NOTE) The eGFR has been calculated using the CKD EPI equation. This calculation has not been validated in all clinical situations. eGFR's persistently <60 mL/min signify possible Chronic Kidney Disease.    Anion gap 6 5 - 15  CBC     Status: Abnormal   Collection Time: 07/29/17  5:15 AM  Result Value Ref Range   WBC 8.7 4.0 - 10.5 K/uL   RBC 3.07 (L) 3.87 - 5.11 MIL/uL   Hemoglobin 9.5 (L) 12.0 - 15.0 g/dL   HCT 28.8 (L) 36.0 - 46.0 %   MCV 93.8 78.0 - 100.0 fL   MCH 30.9 26.0 - 34.0 pg   MCHC 33.0 30.0 - 36.0 g/dL   RDW 13.4 11.5 - 15.5 %   Platelets 204 150 - 400 K/uL    Dg Chest Port 1 View  Result Date: 07/28/2017 CLINICAL DATA:  Preoperative for hip fracture. EXAM: PORTABLE CHEST 1 VIEW COMPARISON:  08/08/2015 FINDINGS: Shallow inspiration with atelectasis in the lung bases. Cardiac enlargement with pulmonary vascular congestion. Mild interstitial pattern to the lungs likely represents mild interstitial edema superimposed upon mild chronic fibrosis. Emphysematous changes are suggested in the lungs. No focal consolidation. Calcification of the aorta. Degenerative changes in the shoulders. Loss of subacromial space may indicate chronic rotator cuff arthropathy. IMPRESSION: 1. Cardiac enlargement with mild vascular congestion and early interstitial edema. 2. Chronic emphysematous changes and fibrosis in the lungs. 3. Aortic atherosclerosis. Electronically Signed   By: Lucienne Capers M.D.   On: 07/28/2017 20:59   Dg Hip Unilat W Or Wo Pelvis 2-3 Views Right  Result Date: 07/28/2017 CLINICAL DATA:  Patient fell and presents with right hip pain. EXAM: DG HIP (WITH OR WITHOUT PELVIS) 2-3V RIGHT COMPARISON:  None. FINDINGS: Acute, closed, varus angulated basicervical fracture of the right femur without joint dislocation. Intact bony pelvis. Lower lumbar fusion  hardware across L4-5. Mild degenerative joint space narrowing both hips. Intact sacroiliac joints and pubic symphysis. No diastasis. Native left hip  appears intact. IMPRESSION: Varus angulated fracture at the base of the femoral neck without joint dislocation. Electronically Signed   By: Ashley Royalty M.D.   On: 07/28/2017 18:57   Independent review of the x-rays shows a right hip displaced low femoral neck fracture   ROS Blood pressure (!) 155/72, pulse 81, temperature 97.9 F (36.6 C), temperature source Oral, resp. rate 17, height 5' 3"  (1.6 m), weight 128 lb (58.1 kg), SpO2 96 %. Physical Exam  Constitutional: She appears well-developed.  HENT:  Head: Normocephalic and atraumatic.  Eyes: Pupils are equal, round, and reactive to light.  Neck: Normal range of motion.  Cardiovascular: Normal rate.  Respiratory: Effort normal.  GI: Soft.  Musculoskeletal:       Right hip: She exhibits decreased range of motion, decreased strength, tenderness and bony tenderness.  Neurological: She is alert.  Skin: Skin is warm and dry.  Psychiatric: She has a normal mood and affect.    Assessment/Plan: Right hip fracture  1) I spoke with the family in length about surgical vs non-surgical treatment of hip fractures such as this.  Given the severity of her pain and the goal of decreased pain and improved quality of life, we will proceed to the OR later this afternoon for surgery to stabilize her right hip.  Mcarthur Rossetti 07/29/2017, 6:54 AM

## 2017-07-29 NOTE — Evaluation (Signed)
SLP Cancellation Note  Patient Details Name: Amy Moody MRN: [id number] DOB: 14-Sep-1916   Cancelled treatment:       Reason Eval/Treat Not Completed: Other (comment)(pt npo for surgery this pm per notes, will continue efforts)   Chales AbrahamsKimball, Amy Moody 07/29/2017, 8:05 AM   Donavan Burnetamara Tanishia Lemaster, MS [facility] SLP [phone]

## 2017-07-29 NOTE — Anesthesia Preprocedure Evaluation (Signed)
Anesthesia Evaluation  Patient identified by MRN, date of birth, ID band Patient awake    Reviewed: Allergy & Precautions, H&P , NPO status , Patient's Chart, lab work & pertinent test results  Airway Mallampati: II   Neck ROM: full    Dental   Pulmonary former smoker,    breath sounds clear to auscultation       Cardiovascular hypertension,  Rhythm:regular Rate:Normal     Neuro/Psych dementia   GI/Hepatic   Endo/Other    Renal/GU Renal InsufficiencyRenal disease     Musculoskeletal   Abdominal   Peds  Hematology  (+) anemia ,   Anesthesia Other Findings   Reproductive/Obstetrics                             Anesthesia Physical Anesthesia Plan  ASA: III  Anesthesia Plan: General   Post-op Pain Management:    Induction: Intravenous  PONV Risk Score and Plan: 3 and Ondansetron, Dexamethasone and Treatment may vary due to age or medical condition  Airway Management Planned: Oral ETT  Additional Equipment:   Intra-op Plan:   Post-operative Plan: Extubation in OR  Informed Consent: I have reviewed the patients History and Physical, chart, labs and discussed the procedure including the risks, benefits and alternatives for the proposed anesthesia with the patient or authorized representative who has indicated his/her understanding and acceptance.     Plan Discussed with: CRNA, Anesthesiologist and Surgeon  Anesthesia Plan Comments:         Anesthesia Quick Evaluation

## 2017-07-29 NOTE — Clinical Social Work Note (Signed)
Clinical Social Work Assessment  Patient Details  Name: Amy Moody MRN: [id number] Date of Birth: April 11, 1917  Date of referral:  07/29/17               Reason for consult:  Facility Placement                Permission sought to share information with:  Family Supports, Chartered certified accountant granted to share information::     Name::        Agency::  SNF  Relationship::     Contact Information:     Housing/Transportation Living arrangements for the past 2 months:  Single Family Home Source of Information:  Patient Patient Interpreter Needed:  None Criminal Activity/Legal Involvement Pertinent to Current Situation/Hospitalization:  No - Comment as needed Significant Relationships:  Adult Children, Spouse Lives with:  Spouse Do you feel safe going back to the place where you live?  Yes Need for family participation in patient care:  Yes (Dependent with mobility)  Care giving concerns:   Patient experienced a fall while getting into her car. The patient fell backwards and complaining of hip hip pain. The patient is scheduled to have surgical intervention  1/4.  Social Worker assessment / plan:  CSW met with the patient, daughter and caregiver at bedside, CSW explain role and reason for visit- to discuss discharge planning. Patient has history of dementia unable to participate in assessment.  Patient daughter reports the patient lives at home and has a private caregiver from 9-5 and family stays with her in the evening.  The patient can complete some ADL's.   Patient family has started discussing d/c planning and states the patient may need short rehab to regain her strength before returning home. The family is open to the patient returning home with Home Health as well.  After the surgical intervention the patient will be evaluated by Physical Therapy to help determine best discharge plan. CSW explain SNF process and later following with bed offers. Patient  daughter express interest in [facility] if space is available.   Plan:Assist with discharge plan  Employment status:  Retired Forensic scientist:  Medicare PT Recommendations:  Not assessed at this time Information / Referral to community resources:  [place]  Patient/Family's Response to care: Agreeable and responding well to care. Patient family appreciative of CSW services.   Patient/Family's Understanding of and Emotional Response to Diagnosis, Current Treatment, and Prognosis: Patient adult children knowledgeable and very involved in patient care. The family is hopeful the patient will tolerate the surgery well.   Emotional Assessment Appearance:  Appears stated age Attitude/Demeanor/Rapport:    Affect (typically observed):  Accepting, Pleasant Orientation:  Oriented to Place, Oriented to Self Alcohol / Substance use:  Not Applicable Psych involvement (Current and /or in the community):  No (Comment)  Discharge Needs  Concerns to be addressed:  Discharge Planning Concerns Readmission within the last 30 days:  No Current discharge risk:  Dependent with Mobility Barriers to Discharge:  Continued Medical Work up   [employer], LCSW 07/29/2017, 10:24 AM

## 2017-07-29 NOTE — Transfer of Care (Signed)
Immediate Anesthesia Transfer of Care Note  Patient: Amy Moody  Procedure(s) Performed: INTRAMEDULLARY (IM) NAIL INTERTROCHANTRIC (Right Hip)  Patient Location: PACU  Anesthesia Type:General  Level of Consciousness: sedated and responds to stimulation  Airway & Oxygen Therapy: Patient Spontanous Breathing and Patient connected to face mask oxygen  Post-op Assessment: Report given to RN and Post -op Vital signs reviewed and stable  Post vital signs: Reviewed and stable  Last Vitals:  Vitals:   07/29/17 0449 07/29/17 1422  BP: (!) 155/72 (!) 156/80  Pulse: 81 80  Resp: 17 15  Temp: 36.6 C 36.4 C  SpO2: 96% 98%    Last Pain:  Vitals:   07/29/17 1422  TempSrc: Oral  PainSc:          Complications: No apparent anesthesia complications

## 2017-07-29 NOTE — Progress Notes (Signed)
Initial Nutrition Assessment  DOCUMENTATION CODES:   Not applicable  INTERVENTION:   Monitor for diet advancement, provide medically appropriate nutritional intervention  NUTRITION DIAGNOSIS:   Increased nutrient needs related to hip fracture as evidenced by estimated needs.  GOAL:   Patient will meet greater than or equal to 90% of their needs  MONITOR:   Diet advancement, Labs, Weight trends, I & O's  REASON FOR ASSESSMENT:   Consult Hip fracture protocol  ASSESSMENT:   Pt with PMH of HTN, dementia, and CKD presents s/p mechanical fall with closed R hip fracture   Unable to see pt at visit, in OR for ORIF No meal completion recorded as pt has been NPO for surgery. Pt with no recent weight history available per chart.   Labs and medications reviewed; Hemoglobin 9.5  NUTRITION - FOCUSED PHYSICAL EXAM:  Unable to assess at this time  Diet Order:  Diet NPO time specified Except for: [employer] Chips, Sips with Meds  EDUCATION NEEDS:   Not appropriate for education at this time  Skin:  Skin Assessment: Reviewed RN Assessment  Last BM:  07/28/17  Height:   Ht Readings from Last 1 Encounters:  07/28/17 5\' 3"  (1.6 m)    Weight:   Wt Readings from Last 1 Encounters:  07/28/17 128 lb (58.1 kg)    Ideal Body Weight:  52.3 kg  BMI:  Body mass index is 22.67 kg/m.  Estimated Nutritional Needs:   Kcal:  1450-1650  Protein:  70-80 grams  Fluid:  >/= 1.4 L/d  Fransisca KaufmannAllison Ioannides, MS, RDN, LDN 07/29/2017 3:54 PM

## 2017-07-29 NOTE — Anesthesia Procedure Notes (Signed)
Procedure Name: Intubation Performed by: Gean Maidens, CRNA Pre-anesthesia Checklist: Patient identified, Emergency Drugs available, Suction available, Patient being monitored and Timeout performed Patient Re-evaluated:Patient Re-evaluated prior to induction Oxygen Delivery Method: Circle system utilized Induction Type: IV induction Ventilation: Mask ventilation without difficulty Laryngoscope Size: Mac and 3 Grade View: Grade I Tube type: Oral Tube size: 7.0 mm Number of attempts: 1 Airway Equipment and Method: Stylet Placement Confirmation: ETT inserted through vocal cords under direct vision,  positive ETCO2,  CO2 detector and breath sounds checked- equal and bilateral Secured at: 21 cm Tube secured with: Tape Dental Injury: Teeth and Oropharynx as per pre-operative assessment

## 2017-07-29 NOTE — Brief Op Note (Signed)
07/28/2017 - 07/29/2017  8:18 PM  PATIENT:  Amy Moody  84 y.o. female  PRE-OPERATIVE DIAGNOSIS:  right hip fracture  POST-OPERATIVE DIAGNOSIS:  right hip fracture  PROCEDURE:  Procedure(s): INTRAMEDULLARY (IM) NAIL INTERTROCHANTRIC (Right)  SURGEON:  Surgeon(s) and Role:    Kathryne Hitch* Tashena Ibach Y, MD - Primary  PHYSICIAN ASSISTANT: Rexene EdisonGil Clark, PA-C  ANESTHESIA:   general  EBL:  50 mL   COUNTS:  YES   DICTATION: .Other Dictation: Dictation Number [id number]  PLAN OF CARE: Admit to inpatient   PATIENT DISPOSITION:  PACU - hemodynamically stable.   Delay start of Pharmacological VTE agent (>24hrs) due to surgical blood loss or risk of bleeding: no

## 2017-07-30 LAB — CBC
HCT: 25.3 % — ABNORMAL LOW (ref 36.0–46.0)
Hemoglobin: 8.3 g/dL — ABNORMAL LOW (ref 12.0–15.0)
MCH: 31 pg (ref 26.0–34.0)
MCHC: 32.8 g/dL (ref 30.0–36.0)
MCV: 94.4 fL (ref 78.0–100.0)
PLATELETS: 169 10*3/uL (ref 150–400)
RBC: 2.68 MIL/uL — AB (ref 3.87–5.11)
RDW: 13.6 % (ref 11.5–15.5)
WBC: 10.8 10*3/uL — ABNORMAL HIGH (ref 4.0–10.5)

## 2017-07-30 LAB — BASIC METABOLIC PANEL
Anion gap: 6 (ref 5–15)
BUN: 33 mg/dL — AB (ref 6–20)
CO2: 24 mmol/L (ref 22–32)
CREATININE: 1.26 mg/dL — AB (ref 0.44–1.00)
Calcium: 8.1 mg/dL — ABNORMAL LOW (ref 8.9–10.3)
Chloride: 107 mmol/L (ref 101–111)
GFR calc Af Amer: 40 mL/min — ABNORMAL LOW (ref >60)
GFR, EST NON AFRICAN AMERICAN: 34 mL/min — AB (ref >60)
GLUCOSE: 141 mg/dL — AB (ref 65–99)
Potassium: 4.7 mmol/L (ref 3.5–5.1)
Sodium: 137 mmol/L (ref 135–145)

## 2017-07-30 LAB — URINALYSIS, ROUTINE W REFLEX MICROSCOPIC
Bilirubin Urine: NEGATIVE
GLUCOSE, UA: NEGATIVE mg/dL
KETONES UR: NEGATIVE mg/dL
LEUKOCYTES UA: NEGATIVE
NITRITE: NEGATIVE
PH: 5 (ref 5.0–8.0)
Protein, ur: 30 mg/dL — AB
Specific Gravity, Urine: 1.019 (ref 1.005–1.030)

## 2017-07-30 MED ORDER — ENSURE ENLIVE PO LIQD
237.0000 mL | Freq: Two times a day (BID) | ORAL | Status: DC
  Administered 2017-07-30 – 2017-08-01 (×5): 237 mL via ORAL

## 2017-07-30 NOTE — Progress Notes (Signed)
Patient Demographics:    Amy Moody, is a 84 y.o. female, DOB - August 15, 1916, ZOX:[id number]  Admit date - 07/28/2017   Admitting Physician Briscoe Deutscherimothy S Opyd, MD  Outpatient Primary MD for the patient is Patient, No Pcp Per  LOS - 2   Chief Complaint  Patient presents with  . Fall  . Hip Pain        Subjective:    Amy Moody today has no fevers, no emesis,  No chest pain, pt and son Jonny Ruiz(John) request DNR status ,  questions answered, oob to chair  Assessment  & Plan :    Principal Problem:   Closed right hip fracture, initial encounter [facility](HCC) Active Problems:   Dementia   Hypertension   Renal disorder   Normocytic anemia  1) Right hip fracture - s/p ORIF by Dr Magnus IvanBlackman on 07/29/17,  Dr Lajoyce Cornersuda covering - Presented with severe right hip pain following a ground-level mechanical fall at home , reports that she simply tripped while trying to get into a car , No Head injury or LOC   2) Hypertension -[facility], may use IV hydralazine as needed for elevated blood pressure  3)AKI-creatinine down to 1.2 from 1.3 on admission, baseline creatinine around 1.1, lisinopril on hold.   4)Chronic Normocytic anemia -baseline hemoglobin usually around 11, hemoglobin  is down to 8.3,  No evidence of ongoing/acute bleeding at this time, consider transfusion if patient becomes symptomatic or if hematocrit close to 21  5)Dementia - Stable, continue [place]     DVT prophylaxis: SCD's  Code Status: DNR Family Communication: Family updated at bedside Disposition Plan:  SNF Consults called: Orthopedic surgery Admission status: Inpatient    Lab Results  Component Value Date   PLT 169 07/30/2017    Inpatient Medications  Scheduled Meds: . aspirin EC  325 mg Oral Q breakfast  . feeding supplement (ENSURE ENLIVE)  237 mL Oral BID BM  . galantamine  4 mg Oral BID WC   Continuous  Infusions: . sodium chloride 20 mL/hr at 07/30/17 1300   PRN Meds:.acetaminophen **OR** acetaminophen, bisacodyl, hydrALAZINE, HYDROcodone-acetaminophen, labetalol, menthol-cetylpyridinium **OR** phenol, metoCLOPramide **OR** metoCLOPramide (REGLAN) injection, morphine injection, ondansetron **OR** ondansetron (ZOFRAN) IV, senna-docusate    Anti-infectives (From admission, onward)   Start     Dose/Rate Route Frequency Ordered Stop   07/29/17 2330  clindamycin (CLEOCIN) IVPB 600 mg     600 mg 100 mL/hr over 30 Minutes Intravenous Every 6 hours 07/29/17 2248 07/30/17 0537   07/29/17 1615  ceFAZolin (ANCEF) IVPB 2g/100 mL premix  Status:  Discontinued     2 g 200 mL/hr over 30 Minutes Intravenous  Once 07/29/17 1408 07/29/17 2206   07/29/17 1417  ceFAZolin (ANCEF) 2-4 GM/100ML-% IVPB    Comments:  Wylene SimmerShepherd, Karen   : cabinet override      07/29/17 1417 07/30/17 0229        Objective:   Vitals:   07/30/17 0115 07/30/17 0526 07/30/17 1039 07/30/17 1407  BP: (!) 156/64 (!) 123/54 (!) 114/53 (!) 135/44  Pulse: 86 83 89 89  Resp: 16 16 17 16   Temp: 98.1 F (36.7 C) 98.5 F (36.9 C) 98.4 F (36.9 C) 98.6 F (37 C)  TempSrc: Axillary  Axillary Oral Oral  SpO2: 97% 100% 100% 95%  Weight:  72.5 kg (159 lb 14.4 oz)    Height:        Wt Readings from Last 3 Encounters:  07/30/17 72.5 kg (159 lb 14.4 oz)     Intake/Output Summary (Last 24 hours) at 07/30/2017 1855 Last data filed at 07/30/2017 1617 Gross per 24 hour  Intake 3931.08 ml  Output 785 ml  Net 3146.08 ml    Physical Exam  Gen:- Awake Alert,  In no apparent distress  HEENT:- [place].AT, No sclera icterus. HOH Neck-Supple Neck,No JVD,.  Lungs-  CTAB  CV- S1, S2 normal, 4/6 SM Abd-  +ve B.Sounds, Abd Soft, No tenderness,    Extremity/Skin:- No  edema,   Post op Rt Hip wound is C/D/I Psych-significant cognitive deficits but no significant behavioral problems Neuro-no new focal neuro deficits  GU- foley with clear urine   Data Review:   Micro Results Recent Results (from the past 240 hour(s))  MRSA PCR Screening     Status: None   Collection Time: 07/29/17  4:06 AM  Result Value Ref Range Status   MRSA by PCR NEGATIVE NEGATIVE Final    Comment:        The GeneXpert MRSA Assay (FDA approved for NASAL specimens only), is one component of a comprehensive MRSA colonization surveillance program. It is not intended to diagnose MRSA infection nor to guide or monitor treatment for MRSA infections.     Radiology Reports Dg Chest Port 1 View  Result Date: 07/28/2017 CLINICAL DATA:  Preoperative for hip fracture. EXAM: PORTABLE CHEST 1 VIEW COMPARISON:  08/08/2015 FINDINGS: Shallow inspiration with atelectasis in the lung bases. Cardiac enlargement with pulmonary vascular congestion. Mild interstitial pattern to the lungs likely represents mild interstitial edema superimposed upon mild chronic fibrosis. Emphysematous changes are suggested in the lungs. No focal consolidation. Calcification of the aorta. Degenerative changes in the shoulders. Loss of subacromial space may indicate chronic rotator cuff arthropathy. IMPRESSION: 1. Cardiac enlargement with mild vascular congestion and early interstitial edema. 2. Chronic emphysematous changes and fibrosis in the lungs. 3. Aortic atherosclerosis. Electronically Signed   By: Burman Nieves M.D.   On: 07/28/2017 20:59   Dg C-arm 1-60 Min-no Report  Result Date: 07/29/2017 Fluoroscopy was utilized by the requesting physician.  No radiographic interpretation.   Dg Hip Unilat W Or Wo Pelvis 2-3 Views Right  Result Date: 07/28/2017 CLINICAL DATA:  Patient fell and presents with right hip pain. EXAM: DG HIP (WITH OR WITHOUT PELVIS) 2-3V RIGHT COMPARISON:  None. FINDINGS: Acute, closed, varus angulated basicervical fracture of the right femur without joint dislocation. Intact bony pelvis. Lower lumbar fusion hardware across L4-5. Mild degenerative joint space narrowing both  hips. Intact sacroiliac joints and pubic symphysis. No diastasis. Native left hip appears intact. IMPRESSION: Varus angulated fracture at the base of the femoral neck without joint dislocation. Electronically Signed   By: Tollie Eth M.D.   On: 07/28/2017 18:57   Dg Femur, Min 2 Views Right  Result Date: 07/29/2017 CLINICAL DATA:  Right femoral fixation. EXAM: RIGHT FEMUR 2 VIEWS; DG C-ARM 1-60 MIN-NO REPORT COMPARISON:  Preoperative radiographs from 07/28/2017 FINDINGS: A single intraoperative AP view over the proximal right femur was provided status post right femoral nail fixation. Two screws are seen along the long axis of the right femoral neck traversing in hazy cervical fracture of the right femur. Alignment is near anatomic. Fine bony detail is limited by the fluoroscopic technique. A  total of 1 minutes 45 seconds of fluoroscopic time was utilized. IMPRESSION: Fluoroscopic time utilized with 1 AP view acquired status post intramedullary nail fixation of basicervical fracture of the right femur. No immediate intraoperative complications. Alignment is near anatomic. Electronically Signed   By: Tollie Eth M.D.   On: 07/29/2017 22:25     CBC Recent Labs  Lab 07/28/17 1822 07/29/17 0515 07/30/17 0513  WBC 8.9 8.7 10.8*  HGB 10.6* 9.5* 8.3*  HCT 32.6* 28.8* 25.3*  PLT 226 204 169  MCV 93.9 93.8 94.4  MCH 30.5 30.9 31.0  MCHC 32.5 33.0 32.8  RDW 13.4 13.4 13.6  LYMPHSABS 1.2  --   --   MONOABS 0.6  --   --   EOSABS 0.1  --   --   BASOSABS 0.0  --   --     Chemistries  Recent Labs  Lab 07/28/17 1822 07/29/17 0515 07/30/17 0513  NA 137 136 137  K 4.4 4.5 4.7  CL 107 106 107  CO2 22 24 24   GLUCOSE 114* 151* 141*  BUN 42* 39* 33*  CREATININE 1.30* 1.21* 1.26*  CALCIUM 8.5* 8.6* 8.1*  AST 18  --   --   ALT 13*  --   --   ALKPHOS 78  --   --   BILITOT 0.6  --   --     ------------------------------------------------------------------------------------------------------------------ No results for input(s): CHOL, HDL, LDLCALC, TRIG, CHOLHDL, LDLDIRECT in the last 72 hours.  No results found for: HGBA1C ------------------------------------------------------------------------------------------------------------------ No results for input(s): TSH, T4TOTAL, T3FREE, THYROIDAB in the last 72 hours.  Invalid input(s): FREET3 ------------------------------------------------------------------------------------------------------------------ No results for input(s): VITAMINB12, FOLATE, FERRITIN, TIBC, IRON, RETICCTPCT in the last 72 hours.  Coagulation profile No results for input(s): INR, PROTIME in the last 168 hours.  No results for input(s): DDIMER in the last 72 hours.  Cardiac Enzymes No results for input(s): CKMB, TROPONINI, MYOGLOBIN in the last 168 hours.  Invalid input(s): CK ------------------------------------------------------------------------------------------------------------------ No results found for: BNP   Shon Hale M.D on 07/30/2017 at 6:55 PM  Between 7am to 7pm - Pager - [phone]  After 7pm go to [email or web address] - password [email or web address]  [facility] -  Office  [phone]   Voice Recognition Reubin Milan dictation system was used to create this note, attempts have been made to correct errors. Please contact the author with questions and/or clarifications.

## 2017-07-30 NOTE — Progress Notes (Signed)
Family ask to make patient a DNR,states that is her status at home. MD Paged D Anne NgFranklin Rn

## 2017-07-30 NOTE — Progress Notes (Signed)
SLP Cancellation Note  Patient Details Name: Amy MelterJuanita L Godman MRN: [id number] DOB: [birth year]   Cancelled treatment:       Reason Eval/Treat Not Completed: SLP screened, no needs identified, will sign off. Pt tolerating diet successfully following hip surgery yesterday. Up in chair no coughing or signs of difficulty per RN or pts daughter who is also Charity fundraiserN. Will defer SLP swallow evaluation at this time, continue current diet but reorder if concerns arise and we will f/u.   Harlon DittyBonnie Layney Gillson, MA CCC-SLP [phone]  Claudine MoutonDeBlois, Cresencia Asmus Caroline 07/30/2017, 2:17 PM

## 2017-07-30 NOTE — Evaluation (Signed)
Occupational Therapy Evaluation Patient Details Name: Amy Moody MRN: [id number] DOB: 12/27/1916 Today's Date: 07/30/2017    History of Present Illness Pt s/p R intramedullary fixation for right hip intertrochanteric fracture s/p fall.  At baseline pt has A with home with ADL activity due to dementia but was able to walk with a walker and at times without a walker. Pt has a VERY supportive family   Clinical Impression   Pt admitted with hip fx s/p fall. Pt currently with functional limitations due to the deficits listed below (see OT Problem List).  Pt will benefit from skilled OT to increase their safety and independence with ADL and functional mobility for ADL to facilitate discharge to venue listed below.      Follow Up Recommendations  SNF;Home health OT;Supervision/Assistance - 24 hour - depending on progress and families ability to provide increased care   Equipment Recommendations  Other (comment)    Recommendations for Other Services       Precautions / Restrictions        Mobility Bed Mobility Overal bed mobility: Needs Assistance Bed Mobility: Supine to Sit     Supine to sit: +2 for physical assistance;+2 for safety/equipment;Max assist        Transfers Overall transfer level: Needs assistance Equipment used: Rolling walker (2 wheeled) Transfers: Sit to/from Stand;Stand Pivot Transfers Sit to Stand: +2 safety/equipment;+2 physical assistance;Max assist Stand pivot transfers: Max assist;+2 physical assistance;+2 safety/equipment       General transfer comment: VC to power up with L LE.  Pain increased with putting weight on L LE        ADL either performed or assessed with clinical judgement   ADL Overall ADL's : Needs assistance/impaired Eating/Feeding: Set up;Sitting   Grooming: Set up;Sitting   Upper Body Bathing: Sitting   Lower Body Bathing: Maximal assistance;+2 for safety/equipment;+2 for physical assistance;Sit to/from stand    Upper Body Dressing : Minimal assistance;Sitting   Lower Body Dressing: Maximal assistance;+2 for safety/equipment;+2 for physical assistance;Sit to/from stand                       Vision Patient Visual Report: No change from baseline       Perception     Praxis      Pertinent Vitals/Pain Pain Assessment: Faces Faces Pain Scale: Hurts even more Pain Location: with movement Pain Descriptors / Indicators: Grimacing;Guarding;Discomfort Pain Intervention(s): Limited activity within patient's tolerance;Monitored during session;Repositioned     Hand Dominance     Extremity/Trunk Assessment Upper Extremity Assessment Upper Extremity Assessment: Generalized weakness           Communication Communication Communication: No difficulties   Cognition Arousal/Alertness: Awake/alert Behavior During Therapy: WFL for tasks assessed/performed Overall Cognitive Status: History of cognitive impairments - at baseline                                       Home Living   Living Arrangements: Alone                               Additional Comments: pt has companion during the day      Prior Functioning/Environment Level of Independence: Needs assistance  Gait / Transfers Assistance Needed: had a walker- would use it sometimes and forget it sometimes ADL's / Homemaking Assistance Needed: pt had min  A with bathing and sometimes needed a reminder to go to the bathroom and change her depends            OT Problem List: Decreased strength;Decreased activity tolerance;Decreased knowledge of use of DME or AE;Pain;Decreased safety awareness;Impaired balance (sitting and/or standing)      OT Treatment/Interventions: Self-care/ADL training;Patient/family education;DME and/or AE instruction;Therapeutic exercise    OT Goals(Current goals can be found in the care plan section) Acute Rehab OT Goals Patient Stated Goal: get back home OT Goal  Formulation: With patient Time For Goal Achievement: 08/13/17 Potential to Achieve Goals: Good  OT Frequency: Min 2X/week   Barriers to D/C:               AM-PAC PT "6 Clicks" Daily Activity     Outcome Measure Help from another person eating meals?: None Help from another person taking care of personal grooming?: A Little Help from another person toileting, which includes using toliet, bedpan, or urinal?: Total Help from another person bathing (including washing, rinsing, drying)?: A Lot Help from another person to put on and taking off regular upper body clothing?: A Little Help from another person to put on and taking off regular lower body clothing?: Total 6 Click Score: 14   End of Session Equipment Utilized During Treatment: Rolling walker Nurse Communication: Mobility status  Activity Tolerance: Patient tolerated treatment well;Patient limited by pain Patient left: in chair  OT Visit Diagnosis: Unsteadiness on feet (R26.81);Pain;Muscle weakness (generalized) (M62.81);History of falling (Z91.81) Pain - Right/Left: Right Pain - part of body: Hip                Time: 9604-5409 OT Time Calculation (min): 31 min Charges:  OT General Charges $OT Visit: 1 Visit OT Evaluation $OT Eval Moderate Complexity: 1 Mod OT Treatments $Self Care/Home Management : 8-22 mins G-Codes:     Lise Auer, OT [phone]  Einar Crow D 07/30/2017, 12:53 PM

## 2017-07-30 NOTE — Progress Notes (Signed)
Patient ID: Amy MelterJuanita L Tremain, female   DOB: [birth year], 84 y.o.   MRN: [id number] Postoperative day 1 status post intramedullary fixation for right hip intertrochanteric fracture.  Patient is up in a chair eating well no complaints.  Discharge planning as per recommendations from physical therapy.

## 2017-07-30 NOTE — Evaluation (Signed)
Physical Therapy Evaluation Patient Details Name: Amy MelterJuanita L Moody MRN: [id number] DOB: 1917-05-23 Today's Date: 07/30/2017   History of Present Illness  Pt s/p R intramedullary fixation for right hip intertrochanteric fracture s/p fall.  At baseline pt has A with home with ADL activity due to dementia but was able to walk with a walker and at times without a walker. Pt has a VERY supportive family  Clinical Impression  Pt in her usual health ambulates at home with a walker and at times without one. Slight dementia therefore family has a companion to sit with her during the day, family comes at night and she sleeps alone til the next morning. Family would prefer to take patient home depending progress here in the hospital (very supportive family) , however open to CIR or SNF if needed. Will continue to follow to increase ability with mobility here in acute care.     Follow Up Recommendations SNF(family would like to take pt home, open to CIR or SNF if need )    Equipment Recommendations  (may need WC, 3n1.Marland Kitchen. if they don't have access to one)    Recommendations for Other Services       Precautions / Restrictions Restrictions Weight Bearing Restrictions: No      Mobility  Bed Mobility Overal bed mobility: Needs Assistance Bed Mobility: Supine to Sit;Sit to Supine       Sit to supine: +2 for physical assistance;Max assist   General bed mobility comments: upper bodaya nd LE due to pain in R hip with this movement today   Transfers Overall transfer level: Needs assistance Equipment used: Rolling walker (2 wheeled) Transfers: Sit to/from Stand Sit to Stand: +2 physical assistance;Mod assist         General transfer comment: 3 trails to stand and hold at RW, pt with more weight on RLE then L oddly. LLE wanted to be in front of R . In order to get back to bed with least amount of pain, we transferred and pivoted to the left from recliner to the bed. ( about 2 small hop/scoot steps  and slight posterior lean as well)   Ambulation/Gait                Stairs            Wheelchair Mobility    Modified Rankin (Stroke Patients Only)       Balance Overall balance assessment: Needs assistance Sitting-balance support: Bilateral upper extremity supported;Feet supported Sitting balance-Leahy Scale: Fair     Standing balance support: Bilateral upper extremity supported;During functional activity Standing balance-Leahy Scale: Poor                               Pertinent Vitals/Pain Pain Assessment: 0-10 Faces Pain Scale: Hurts little more Pain Location: with movement in R hip , and anterior area of hip  Pain Descriptors / Indicators: Aching;Discomfort Pain Intervention(s): Limited activity within patient's tolerance;Monitored during session;Patient requesting pain meds-RN notified;Ice applied    Home Living Family/patient expects to be discharged to:: Unsure Living Arrangements: Alone               Additional Comments: pt has companion during the day, 8-5 and family stays with her in the evenings     Prior Function Level of Independence: Independent with assistive device(s)   Gait / Transfers Assistance Needed: had a walker- would use it sometimes and forget it sometimes  ADL's /  Homemaking Assistance Needed: pt had min A with bathing and sometimes needed a reminder to go to the bathroom and change her depends        Hand Dominance        Extremity/Trunk Assessment        Lower Extremity Assessment Lower Extremity Assessment: Generalized weakness(limited on R due to painwith movement of R LE even with knee flexion and extension)       Communication   Communication: HOH  Cognition Arousal/Alertness: Awake/alert Behavior During Therapy: WFL for tasks assessed/performed Overall Cognitive Status: History of cognitive impairments - at baseline                                        General Comments       Exercises Total Joint Exercises Ankle Circles/Pumps: AROM;5 reps;Both   Assessment/Plan    PT Assessment Patient needs continued PT services  PT Problem List Decreased strength;Decreased activity tolerance;Decreased balance;Decreased mobility       PT Treatment Interventions Gait training;Stair training;Functional mobility training;Therapeutic activities;Therapeutic exercise;Patient/family education    PT Goals (Current goals can be found in the Care Plan section)  Acute Rehab PT Goals Patient Stated Goal: get back home PT Goal Formulation: With patient/family Time For Goal Achievement: 08/13/17 Potential to Achieve Goals: Good    Frequency Min 4X/week   Barriers to discharge        Co-evaluation               AM-PAC PT "6 Clicks" Daily Activity  Outcome Measure Difficulty turning over in bed (including adjusting bedclothes, sheets and blankets)?: Unable Difficulty moving from lying on back to sitting on the side of the bed? : Unable Difficulty sitting down on and standing up from a chair with arms (e.g., wheelchair, bedside commode, etc,.)?: Unable Help needed moving to and from a bed to chair (including a wheelchair)?: A Lot Help needed walking in hospital room?: Total Help needed climbing 3-5 steps with a railing? : Total 6 Click Score: 7    End of Session Equipment Utilized During Treatment: Gait belt Activity Tolerance: Patient limited by pain Patient left: in bed;with call bell/phone within reach;with family/visitor present;with SCD's reapplied Nurse Communication: Mobility status(CNA assisted with the transfer ) PT Visit Diagnosis: Unsteadiness on feet (R26.81);Other abnormalities of gait and mobility (R26.89)    Time: 1410-1455 PT Time Calculation (min) (ACUTE ONLY): 45 min   Charges:   PT Evaluation $PT Eval Low Complexity: 1 Low PT Treatments $Therapeutic Activity: 23-37 mins   PT G Codes:   PT G-Codes **NOT FOR INPATIENT  CLASS** Functional Assessment Tool Used: AM-PAC 6 Clicks Basic Mobility    Marella Bile, [place] Pager: [phone] 07/30/2017   Dannielle Baskins, Clois Dupes 07/30/2017, 5:45 PM

## 2017-07-31 LAB — BASIC METABOLIC PANEL
ANION GAP: 5 (ref 5–15)
BUN: 45 mg/dL — ABNORMAL HIGH (ref 6–20)
CALCIUM: 7.8 mg/dL — AB (ref 8.9–10.3)
CO2: 23 mmol/L (ref 22–32)
Chloride: 108 mmol/L (ref 101–111)
Creatinine, Ser: 1.31 mg/dL — ABNORMAL HIGH (ref 0.44–1.00)
GFR, EST AFRICAN AMERICAN: 38 mL/min — AB (ref >60)
GFR, EST NON AFRICAN AMERICAN: 33 mL/min — AB (ref >60)
Glucose, Bld: 112 mg/dL — ABNORMAL HIGH (ref 65–99)
POTASSIUM: 4.3 mmol/L (ref 3.5–5.1)
Sodium: 136 mmol/L (ref 135–145)

## 2017-07-31 LAB — CBC
HEMATOCRIT: 22.4 % — AB (ref 36.0–46.0)
Hemoglobin: 7.3 g/dL — ABNORMAL LOW (ref 12.0–15.0)
MCH: 31.2 pg (ref 26.0–34.0)
MCHC: 32.6 g/dL (ref 30.0–36.0)
MCV: 95.7 fL (ref 78.0–100.0)
Platelets: 142 10*3/uL — ABNORMAL LOW (ref 150–400)
RBC: 2.34 MIL/uL — ABNORMAL LOW (ref 3.87–5.11)
RDW: 13.8 % (ref 11.5–15.5)
WBC: 9.1 10*3/uL (ref 4.0–10.5)

## 2017-07-31 LAB — PREPARE RBC (CROSSMATCH)

## 2017-07-31 MED ORDER — HYDRALAZINE HCL 20 MG/ML IJ SOLN
10.0000 mg | Freq: Once | INTRAMUSCULAR | Status: AC
  Administered 2017-07-31: 10 mg via INTRAVENOUS
  Filled 2017-07-31: qty 1

## 2017-07-31 MED ORDER — SODIUM CHLORIDE 0.9 % IV SOLN: Freq: Once | INTRAVENOUS | Status: DC

## 2017-07-31 MED ORDER — FUROSEMIDE 10 MG/ML IJ SOLN
20.0000 mg | Freq: Once | INTRAMUSCULAR | Status: AC
  Administered 2017-07-31: 21:00:00 20 mg via INTRAVENOUS
  Filled 2017-07-31: qty 2

## 2017-07-31 NOTE — Progress Notes (Signed)
PT Cancellation Note  Patient Details Name: Amy MelterJuanita L Mcgaugh MRN: [id number] DOB: 13-Apr-1917   Cancelled Treatment:    Reason Eval/Treat Not Completed: Medical issues which prohibited therapy, getting blood.   Rada HayHill, Makaley Storts [place] 07/31/2017, 1:57 PM Blanchard KelchKaren Troy Kanouse PT [phone]

## 2017-07-31 NOTE — Anesthesia Postprocedure Evaluation (Signed)
Anesthesia Post Note  Patient: Veneda MelterJuanita L Kulig  Procedure(s) Performed: INTRAMEDULLARY (IM) NAIL INTERTROCHANTRIC (Right Hip)     Patient location during evaluation: PACU Anesthesia Type: General Level of consciousness: awake and alert Pain management: pain level controlled Vital Signs Assessment: post-procedure vital signs reviewed and stable Respiratory status: spontaneous breathing, nonlabored ventilation, respiratory function stable and patient connected to nasal cannula oxygen Cardiovascular status: blood pressure returned to baseline and stable Postop Assessment: no apparent nausea or vomiting Anesthetic complications: no    Last Vitals:  Vitals:   07/30/17 2203 07/31/17 0609  BP: (!) 110/48 (!) 141/55  Pulse: (!) 105 89  Resp: 16 16  Temp:  37.1 C  SpO2: 95% 94%    Last Pain:  Vitals:   07/31/17 0609  TempSrc: Axillary  PainSc: Asleep                 Elynor Kallenberger S

## 2017-07-31 NOTE — Progress Notes (Signed)
Patient Demographics:    Amy Moody, is a 84 y.o. female, DOB - September 22, 1916, ZOX:[id number]  Admit date - 07/28/2017   Admitting Physician Briscoe Deutscher, MD  Outpatient Primary MD for the patient is Patient, No Pcp Per  LOS - 3   Chief Complaint  Patient presents with  . Fall  . Hip Pain        Subjective:    Amy Moody today has no fevers, no emesis,  No chest pain,  son Amy Moody) and daughter-in-law at bedside, questions answered, patient is eating okay    Assessment  & Plan :    Principal Problem:   Closed right hip fracture, initial encounter [facility]) Active Problems:   Dementia   Hypertension   Renal disorder   Normocytic anemia  1) Right hip fracture - s/p ORIF by Dr Magnus Ivan on 07/29/17,    - Presented with severe right hip pain following a ground-level mechanical fall at home , reports that she simply tripped while trying to get into a car , No Head injury or LOC   2) Hypertension - stable, lisinopril on hold due to [facility] may use IV hydralazine as needed for elevated blood pressure  3)AKI-creatinine  1.3, baseline creatinine around 1.1, lisinopril on hold.   4) acute on chronic chronic Normocytic anemia - baseline hemoglobin usually around 11, hemoglobin  is down to 7.3, suspect due to acute blood loss related to hip surgery as well as hemodilution with IVF,  no evidence of ongoing/acute bleeding at this time, Transfuse 1 unit of packed cells over 3 hours, please give Lasix 20 mg IV x1 after transfusion of packed cells. Risk, benefits and alternatives to transfusion of blood products discussed. Indication for transfusion discussed.    5)Dementia - Stable, continue Razadyne     DVT prophylaxis: SCD's  Code Status: DNR Family Communication: Family updated at bedside Disposition Plan:  SNF Consults called: Orthopedic surgery Admission status: Inpatient    Lab Results    Component Value Date   PLT 142 (L) 07/31/2017    Inpatient Medications  Scheduled Meds: . aspirin EC  325 mg Oral Q breakfast  . feeding supplement (ENSURE ENLIVE)  237 mL Oral BID BM  . furosemide  20 mg Intravenous Once  . galantamine  4 mg Oral BID WC   Continuous Infusions: . sodium chloride 40 mL/hr at 07/30/17 2014  . sodium chloride     PRN Meds:.acetaminophen **OR** acetaminophen, bisacodyl, hydrALAZINE, HYDROcodone-acetaminophen, labetalol, menthol-cetylpyridinium **OR** phenol, metoCLOPramide **OR** metoCLOPramide (REGLAN) injection, morphine injection, ondansetron **OR** ondansetron (ZOFRAN) IV, senna-docusate    Anti-infectives (From admission, onward)   Start     Dose/Rate Route Frequency Ordered Stop   07/29/17 2330  clindamycin (CLEOCIN) IVPB 600 mg     600 mg 100 mL/hr over 30 Minutes Intravenous Every 6 hours 07/29/17 2248 07/30/17 0537   07/29/17 1615  ceFAZolin (ANCEF) IVPB 2g/100 mL premix  Status:  Discontinued     2 g 200 mL/hr over 30 Minutes Intravenous  Once 07/29/17 1408 07/29/17 2206   07/29/17 1417  ceFAZolin (ANCEF) 2-4 GM/100ML-% IVPB    Comments:  Amy Moody   : cabinet override      07/29/17 1417 07/30/17 8119  Objective:   Vitals:   07/30/17 1039 07/30/17 1407 07/30/17 2203 07/31/17 0609  BP: (!) 114/53 (!) 135/44 (!) 110/48 (!) 141/55  Pulse: 89 89 (!) 105 89  Resp: 17 16 16 16   Temp: 98.4 F (36.9 C) 98.6 F (37 C)  98.8 F (37.1 C)  TempSrc: Oral Oral  Axillary  SpO2: 100% 95% 95% 94%  Weight:      Height:        Wt Readings from Last 3 Encounters:  07/30/17 72.5 kg (159 lb 14.4 oz)     Intake/Output Summary (Last 24 hours) at 07/31/2017 1353 Last data filed at 07/31/2017 0600 Gross per 24 hour  Intake 1345.33 ml  Output 610 ml  Net 735.33 ml    Physical Exam  Gen:- Awake Alert,  In no apparent distress  HEENT:- [place].AT, No sclera icterus. HOH Neck-Supple Neck,No JVD,.  Lungs-  CTAB  CV- S1, S2 normal,  4/6 SM Abd-  +ve B.Sounds, Abd Soft, No tenderness,    Extremity/Skin:- No  edema,   Post op Rt Hip wound is C/D/I Psych-significant cognitive deficits but no significant behavioral problems Neuro-no new focal neuro deficits  GU- foley with clear urine  Data Review:   Micro Results Recent Results (from the past 240 hour(s))  MRSA PCR Screening     Status: None   Collection Time: 07/29/17  4:06 AM  Result Value Ref Range Status   MRSA by PCR NEGATIVE NEGATIVE Final    Comment:        The GeneXpert MRSA Assay (FDA approved for NASAL specimens only), is one component of a comprehensive MRSA colonization surveillance program. It is not intended to diagnose MRSA infection nor to guide or monitor treatment for MRSA infections.     Radiology Reports Dg Chest Port 1 View  Result Date: 07/28/2017 CLINICAL DATA:  Preoperative for hip fracture. EXAM: PORTABLE CHEST 1 VIEW COMPARISON:  08/08/2015 FINDINGS: Shallow inspiration with atelectasis in the lung bases. Cardiac enlargement with pulmonary vascular congestion. Mild interstitial pattern to the lungs likely represents mild interstitial edema superimposed upon mild chronic fibrosis. Emphysematous changes are suggested in the lungs. No focal consolidation. Calcification of the aorta. Degenerative changes in the shoulders. Loss of subacromial space may indicate chronic rotator cuff arthropathy. IMPRESSION: 1. Cardiac enlargement with mild vascular congestion and early interstitial edema. 2. Chronic emphysematous changes and fibrosis in the lungs. 3. Aortic atherosclerosis. Electronically Signed   By: Burman NievesWilliam  Stevens M.D.   On: 07/28/2017 20:59   Dg C-arm 1-60 Min-no Report  Result Date: 07/29/2017 Fluoroscopy was utilized by the requesting physician.  No radiographic interpretation.   Dg Hip Unilat W Or Wo Pelvis 2-3 Views Right  Result Date: 07/28/2017 CLINICAL DATA:  Patient fell and presents with right hip pain. EXAM: DG HIP (WITH OR  WITHOUT PELVIS) 2-3V RIGHT COMPARISON:  None. FINDINGS: Acute, closed, varus angulated basicervical fracture of the right femur without joint dislocation. Intact bony pelvis. Lower lumbar fusion hardware across L4-5. Mild degenerative joint space narrowing both hips. Intact sacroiliac joints and pubic symphysis. No diastasis. Native left hip appears intact. IMPRESSION: Varus angulated fracture at the base of the femoral neck without joint dislocation. Electronically Signed   By: Tollie Ethavid  Kwon M.D.   On: 07/28/2017 18:57   Dg Femur, Min 2 Views Right  Result Date: 07/29/2017 CLINICAL DATA:  Right femoral fixation. EXAM: RIGHT FEMUR 2 VIEWS; DG C-ARM 1-60 MIN-NO REPORT COMPARISON:  Preoperative radiographs from 07/28/2017 FINDINGS: A single intraoperative AP  view over the proximal right femur was provided status post right femoral nail fixation. Two screws are seen along the long axis of the right femoral neck traversing in hazy cervical fracture of the right femur. Alignment is near anatomic. Fine bony detail is limited by the fluoroscopic technique. A total of 1 minutes 45 seconds of fluoroscopic time was utilized. IMPRESSION: Fluoroscopic time utilized with 1 AP view acquired status post intramedullary nail fixation of basicervical fracture of the right femur. No immediate intraoperative complications. Alignment is near anatomic. Electronically Signed   By: Tollie Eth M.D.   On: 07/29/2017 22:25     CBC Recent Labs  Lab 07/28/17 1822 07/29/17 0515 07/30/17 0513 07/31/17 0517  WBC 8.9 8.7 10.8* 9.1  HGB 10.6* 9.5* 8.3* 7.3*  HCT 32.6* 28.8* 25.3* 22.4*  PLT 226 204 169 142*  MCV 93.9 93.8 94.4 95.7  MCH 30.5 30.9 31.0 31.2  MCHC 32.5 33.0 32.8 32.6  RDW 13.4 13.4 13.6 13.8  LYMPHSABS 1.2  --   --   --   MONOABS 0.6  --   --   --   EOSABS 0.1  --   --   --   BASOSABS 0.0  --   --   --     Chemistries  Recent Labs  Lab 07/28/17 1822 07/29/17 0515 07/30/17 0513 07/31/17 0517  NA 137 136  137 136  K 4.4 4.5 4.7 4.3  CL 107 106 107 108  CO2 22 24 24 23   GLUCOSE 114* 151* 141* 112*  BUN 42* 39* 33* 45*  CREATININE 1.30* 1.21* 1.26* 1.31*  CALCIUM 8.5* 8.6* 8.1* 7.8*  AST 18  --   --   --   ALT 13*  --   --   --   ALKPHOS 78  --   --   --   BILITOT 0.6  --   --   --    ------------------------------------------------------------------------------------------------------------------ No results for input(s): CHOL, HDL, LDLCALC, TRIG, CHOLHDL, LDLDIRECT in the last 72 hours.  No results found for: HGBA1C ------------------------------------------------------------------------------------------------------------------ No results for input(s): TSH, T4TOTAL, T3FREE, THYROIDAB in the last 72 hours.  Invalid input(s): FREET3 ------------------------------------------------------------------------------------------------------------------ No results for input(s): VITAMINB12, FOLATE, FERRITIN, TIBC, IRON, RETICCTPCT in the last 72 hours.  Coagulation profile No results for input(s): INR, PROTIME in the last 168 hours.  No results for input(s): DDIMER in the last 72 hours.  Cardiac Enzymes No results for input(s): CKMB, TROPONINI, MYOGLOBIN in the last 168 hours.  Invalid input(s): CK ------------------------------------------------------------------------------------------------------------------ No results found for: BNP   Shon Hale M.D on 07/31/2017 at 1:53 PM  Between 7am to 7pm - Pager - [phone]  After 7pm go to [email or web address] - password [email or web address]  [facility] -  Office  [phone]   Voice Recognition Amy Moody dictation system was used to create this note, attempts have been made to correct errors. Please contact the author with questions and/or clarifications.

## 2017-07-31 NOTE — Care Management Note (Signed)
Case Management Note  Patient Details  Name: Amy Moody MRN: [id number] Date of Birth: 05/18/1917  Subjective/Objective:     status post intramedullary fixation for right hip intertrochanteric fracture   Action/Plan: Discharge Planning:  NCM spoke to son and they do want SNF rehab at [facility]. Will notify CSW with referral to SNF.     Expected Discharge Date:                 Expected Discharge Plan:  Skilled Nursing Facility  In-House Referral:  Clinical Social Work  Discharge planning Services  CM Consult  Post Acute Care Choice:  NA Choice offered to:  NA  DME Arranged:  N/A DME Agency:  NA  HH Arranged:  NA HH Agency:  NA  Status of Service:  In process, will continue to follow  If discussed at Long Length of Stay Meetings, dates discussed:    Additional Comments:  Elliot CousinShavis, Avagail Whittlesey Ellen, RN 07/31/2017, 1:51 PM

## 2017-07-31 NOTE — NC FL2 (Signed)
[place] MEDICAID FL2 LEVEL OF CARE SCREENING TOOL     IDENTIFICATION  Patient Name: Amy Moody Birthdate: [birth year] Sex: female Admission Date (Current Location): 07/28/2017  [facility] [place]   Facility and Address:  [facility],  [address], [place] [id number]      Provider Number: [id number]  Attending Physician Name and Address:  Shon HaleEmokpae, Courage, MD  Relative Name and Phone Number:  Irma NewnessJohn Stum, son, [phone]    Current Level of Care:   Recommended Level of Care: Skilled Nursing Facility Prior Approval Number:    Date Approved/Denied:   PASRR Number: [id number] [id number]  Discharge Plan: SNF    Current Diagnoses: Patient Active Problem List   Diagnosis Date Noted  . Dementia 07/28/2017  . Hypertension 07/28/2017  . Renal disorder 07/28/2017  . Closed right hip fracture, initial encounter (HCC) 07/28/2017  . Normocytic anemia 07/28/2017    Orientation RESPIRATION BLADDER Height & Weight     Self, Situation, Place  Normal Incontinent Weight: 159 lb 14.4 oz (72.5 kg) Height:  5\' 3"  (160 cm)  BEHAVIORAL SYMPTOMS/MOOD NEUROLOGICAL BOWEL NUTRITION STATUS      Continent Diet(See DC Summary)  AMBULATORY STATUS COMMUNICATION OF NEEDS Skin   Extensive Assist Verbally Surgical wounds                       Personal Care Assistance Level of Assistance  Dressing, Bathing, Feeding Bathing Assistance: Maximum assistance Feeding assistance: Limited assistance Dressing Assistance: Maximum assistance     Functional Limitations Info  Sight, Hearing, Speech Sight Info: Adequate Hearing Info: Adequate Speech Info: Adequate    SPECIAL CARE FACTORS FREQUENCY  PT (By licensed PT), OT (By licensed OT)     PT Frequency: 5x week OT Frequency: 5x week            Contractures Contractures Info: Not present    Additional Factors Info  Code Status, Allergies Code Status Info: DNR Allergies Info: Penicillins           Current Medications (07/31/2017):  This is the current hospital active medication list Current Facility-Administered Medications  Medication Dose Route Frequency Provider Last Rate Last Dose  . 0.9 %  sodium chloride infusion   Intravenous Continuous Shon HaleEmokpae, Courage, MD 40 mL/hr at 07/30/17 2014    . 0.9 %  sodium chloride infusion   Intravenous Once Emokpae, Courage, MD      . acetaminophen (TYLENOL) tablet 650 mg  650 mg Oral Q6H PRN Kathryne HitchBlackman, Christopher Y, MD   650 mg at 07/31/17 1015   Or  . acetaminophen (TYLENOL) suppository 650 mg  650 mg Rectal Q6H PRN Kathryne HitchBlackman, Christopher Y, MD      . aspirin EC tablet 325 mg  325 mg Oral Q breakfast Kathryne HitchBlackman, Christopher Y, MD   325 mg at 07/31/17 1015  . bisacodyl (DULCOLAX) EC tablet 5 mg  5 mg Oral Daily PRN Opyd, Lavone Neriimothy S, MD      . feeding supplement (ENSURE ENLIVE) (ENSURE ENLIVE) liquid 237 mL  237 mL Oral BID BM Kathryne HitchBlackman, Christopher Y, MD   237 mL at 07/31/17 1047  . furosemide (LASIX) injection 20 mg  20 mg Intravenous Once Emokpae, Courage, MD      . galantamine (RAZADYNE) tablet 4 mg  4 mg Oral BID WC Emokpae, Courage, MD   4 mg at 07/31/17 1015  . hydrALAZINE (APRESOLINE) injection 10 mg  10 mg Intravenous Q6H PRN Shon HaleEmokpae, Courage, MD      .  HYDROcodone-acetaminophen (NORCO/VICODIN) 5-325 MG per tablet 1-2 tablet  1-2 tablet Oral Q6H PRN Kathryne Hitch, MD   1 tablet at 07/31/17 1242  . labetalol (NORMODYNE,TRANDATE) injection 5-10 mg  5-10 mg Intravenous Q2H PRN Opyd, Lavone Neri, MD   5 mg at 07/28/17 2221  . menthol-cetylpyridinium (CEPACOL) lozenge 3 mg  1 lozenge Oral PRN Kathryne Hitch, MD       Or  . phenol (CHLORASEPTIC) mouth spray 1 spray  1 spray Mouth/Throat PRN Kathryne Hitch, MD      . metoCLOPramide (REGLAN) tablet 5-10 mg  5-10 mg Oral Q8H PRN Kathryne Hitch, MD       Or  . metoCLOPramide (REGLAN) injection 5-10 mg  5-10 mg Intravenous Q8H PRN Kathryne Hitch, MD      .  morphine 2 MG/ML injection 0.5 mg  0.5 mg Intravenous Q2H PRN Kathryne Hitch, MD   0.5 mg at 07/31/17 0024  . ondansetron (ZOFRAN) tablet 4 mg  4 mg Oral Q6H PRN Kathryne Hitch, MD       Or  . ondansetron [facility]) injection 4 mg  4 mg Intravenous Q6H PRN Kathryne Hitch, MD      . senna-docusate (Senokot-S) tablet 1 tablet  1 tablet Oral QHS PRN Opyd, Lavone Neri, MD         Discharge Medications: Please see discharge summary for a list of discharge medications.  Relevant Imaging Results:  Relevant Lab Results:   Additional Information SS#: [id number] [place], [place]

## 2017-07-31 NOTE — Progress Notes (Signed)
PT Cancellation Note  Patient Details Name: Amy MelterJuanita L Moody MRN: [id number] DOB: 09-20-1916   Cancelled Treatment:    Reason Eval/Treat Not Completed: Fatigue/lethargy limiting ability to participate, per family,  Medication given last PM and patient currently sound asleep. Will check back later AM.    Rada HayHill, Phillips Goulette Elizabeth 07/31/2017, 8:32 AM Blanchard KelchKaren Lataria Courser PT [phone]

## 2017-07-31 NOTE — Progress Notes (Signed)
Rehab Admissions Coordinator Note:  Patient was screened by Trish MageLogue, Alauna Hayden M for appropriateness for an Inpatient Acute Rehab Consult.  At this time, we are recommending Skilled Nursing Facility.  Lelon FrohlichLogue, Treavor Blomquist M 07/31/2017, 9:30 AM  I can be reached at [phone].

## 2017-08-01 ENCOUNTER — Encounter (HOSPITAL_COMMUNITY): Payer: Self-pay | Admitting: Orthopaedic Surgery

## 2017-08-01 LAB — TYPE AND SCREEN
ABO/RH(D): O POS
Antibody Screen: NEGATIVE
UNIT DIVISION: 0
Unit division: 0

## 2017-08-01 LAB — BPAM RBC
BLOOD PRODUCT EXPIRATION DATE: 201901302359
Blood Product Expiration Date: 201901302359
ISSUE DATE / TIME: 201901061611
ISSUE DATE / TIME: 201901061645
UNIT TYPE AND RH: 5100
Unit Type and Rh: 5100

## 2017-08-01 LAB — BASIC METABOLIC PANEL
ANION GAP: 9 (ref 5–15)
BUN: 41 mg/dL — ABNORMAL HIGH (ref 6–20)
CHLORIDE: 103 mmol/L (ref 101–111)
CO2: 23 mmol/L (ref 22–32)
Calcium: 8.2 mg/dL — ABNORMAL LOW (ref 8.9–10.3)
Creatinine, Ser: 1.12 mg/dL — ABNORMAL HIGH (ref 0.44–1.00)
GFR calc Af Amer: 46 mL/min — ABNORMAL LOW (ref >60)
GFR calc non Af Amer: 40 mL/min — ABNORMAL LOW (ref >60)
GLUCOSE: 105 mg/dL — AB (ref 65–99)
POTASSIUM: 4.5 mmol/L (ref 3.5–5.1)
SODIUM: 135 mmol/L (ref 135–145)

## 2017-08-01 MED ORDER — ASPIRIN 325 MG PO TBEC: 325.0000 mg | DELAYED_RELEASE_TABLET | Freq: Every day | ORAL | 0 refills | Status: AC

## 2017-08-01 MED ORDER — HYDROCODONE-ACETAMINOPHEN 5-325 MG PO TABS
1.0000 | ORAL_TABLET | Freq: Four times a day (QID) | ORAL | 0 refills | Status: AC | PRN
Start: 2017-08-01 — End: ?

## 2017-08-01 NOTE — Progress Notes (Signed)
Patient ID: Amy Moody, female   DOB: Dec 07, 1916, 84 y.o.   MRN: [id number] Family at the bedside.  Significant limitations in mobility given her age and fracture.  H&H stable today.  Will need skilled nursing placement.  Ok from Ortho standpoint for SNF.

## 2017-08-01 NOTE — Op Note (Signed)
NAME:  Amy Moody, Amy Moody                 ACCOUNT NO.:  MEDICAL RECORD NO.:  [id number]  LOCATION:                                 FACILITY:  PHYSICIAN:  Vanita PandaChristopher Y. Magnus IvanBlackman, M.D.DATE OF BIRTH:  DATE OF PROCEDURE:  07/29/2017 DATE OF DISCHARGE:                              OPERATIVE REPORT   PREOPERATIVE DIAGNOSIS:  Right basicervical low femoral neck/high intertrochanteric proximal femur fracture.  POSTOPERATIVE DIAGNOSIS:  Right basicervical low femoral neck/high intertrochanteric proximal femur fracture.  PROCEDURE:  Open reduction and internal fixation of right proximal femur fracture using intramedullary rod and hip screw construct.  IMPLANTS:  Katrinka BlazingSmith and [employer]hew INTERTAN 10 mm x 36 cm femoral nail with an integrated interlocking lag screw, 100 mm lag screw with a 95 mm compression screw.  SURGEON:  Vanita PandaChristopher Y. Magnus IvanBlackman, M.D.  ASSISTANT:  Richardean CanalGilbert Clark, PA-C.  ANESTHESIA:  General.  BLOOD LOSS:  Less than 100 mL.  COMPLICATIONS:  None.  INDICATIONS:  [name] is a 84 year old female, who sustained a mechanical fall last evening.  [facility] and found to have a displaced basicervical low femoral neck fracture/high intertrochanteric fracture.  She was admitted to the Medicine Service and evaluated by Orthopedics as well.  I talked to her son in detail and she does walk quite a bit, and they felt that surgery was indicated, which I agreed.  This was based on decreasing her pain, improving her mobility and hopefully improving her quality of life.  PROCEDURE DESCRIPTION:  After informed consent was obtained, appropriate right hip was marked.  She was brought to the operating room where general anesthesia was obtained while she was on her stretcher.  A Foley catheter was placed.  Next, she was placed supine on the fracture table with the right operative leg in in-line skeletal traction, perineal post in place, and the left  hip flexed and abducted out of the field with appropriate well holder and appropriate padding in the popliteal area. We then were able to pull some traction on her leg and assessed her fracture under direct fluoroscopy, and reduced it in acceptable near anatomic position.  We then chose our femoral nail being a 10 mm x 36 cm femoral nail by keeping it sterile on the box and assessing it radiographically.  We passed this off sterilely off the back table.  We then prepped her right hip with DuraPrep and sterile drapes.  Time-out was called and she was identified as correct patient and correct right hip.  We then made an incision proximal to the greater trochanter on the lateral aspect of her hip and dissected down to the tip of greater trochanter.  We placed temporary guidepin in antegrade fashion under direct fluoroscopy.  We then used an initiating reamer up the femoral canal and removed the guidepin.  We placed the femoral rod then in antegrade fashion without needing to ream all the way down the femoral canal.  Using the outrigger guide set through a separate lateral incision, we were then able to place temporary guidepins and make measurements off this to place our interdigitating interlocking lag and compression screw measuring 100 mm lag  screw with 95 mm compression screw construct, this was able to stabilize the fracture.  All instrumentation and the outrigger guide were then removed and we put the hip through range of motion with internal and external rotation under fluoroscopy finding a stable hip.  We then irrigated the both wounds with normal saline solution, closed the deep tissue with 0 Vicryl followed by 2-0 Vicryl in the subcutaneous tissue, interrupted staples on the skin.  Xeroform and well-padded sterile dressing were applied. She was taken off the fracture table, awakened, extubated and taken to the recovery room in stable condition.  All final counts were correct. There  were no complications noted.     Vanita Panda. Magnus Ivan, M.D.     CYB/MEDQ  D:  07/29/2017  T:  07/29/2017  Job:  [id number]

## 2017-08-01 NOTE — Progress Notes (Signed)
Patient Demographics:    Amy Moody, is a 84 y.o. female, DOB - 09/16/1916, ZOX:[id number]  Admit date - 07/28/2017   Admitting Physician Briscoe Deutscherimothy S Opyd, MD  Outpatient Primary MD for the patient is Patient, No Pcp Per  LOS - 4  Chief Complaint  Patient presents with  . Fall  . Hip Pain        Subjective:    Amy Moody today has no fevers, no emesis,  No chest pain,  family at bedside, questions answered, patient is eating okay, no BM   Assessment  & Plan :    Principal Problem:   Closed right hip fracture, initial encounter [facility](HCC) Active Problems:   Dementia   Hypertension   Renal disorder   Normocytic anemia  1) Right hip fracture - s/p ORIF by Dr Magnus IvanBlackman on 07/29/17,    - Presented with severe right hip pain following a ground-level mechanical fall at home , reports that she simply tripped while trying to get into a car , No Head injury or LOC   2) Hypertension - stable, [facility], may use IV hydralazine as needed for elevated blood pressure  3)AKI- Resolved creatinine is 1.1,  baseline creatinine around 1.1, lisinopril on hold.   4) acute on chronic chronic Normocytic anemia - baseline hemoglobin usually around 11, hemoglobin  is down up to 11.1 from  7.3 post transfusion of 1 unit of PRBCs in 07/31/17, s   5)Dementia - Stable, continue [place]      DVT prophylaxis: SCD's  Code Status: DNR Family Communication: Family updated at bedside Disposition Plan:  SNF Consults called: Orthopedic surgery Admission status: Inpatient    Lab Results  Component Value Date   PLT 145 (L) 08/01/2017    Inpatient Medications  Scheduled Meds: . aspirin EC  325 mg Oral Q breakfast  . feeding supplement (ENSURE ENLIVE)  237 mL Oral BID BM  . galantamine  4 mg Oral BID WC   Continuous Infusions: . sodium chloride 40 mL/hr at 07/31/17 2230  . sodium chloride       PRN Meds:.acetaminophen **OR** acetaminophen, bisacodyl, hydrALAZINE, HYDROcodone-acetaminophen, labetalol, menthol-cetylpyridinium **OR** phenol, metoCLOPramide **OR** metoCLOPramide (REGLAN) injection, morphine injection, ondansetron **OR** ondansetron (ZOFRAN) IV, senna-docusate    Anti-infectives (From admission, onward)   Start     Dose/Rate Route Frequency Ordered Stop   07/29/17 2330  clindamycin (CLEOCIN) IVPB 600 mg     600 mg 100 mL/hr over 30 Minutes Intravenous Every 6 hours 07/29/17 2248 07/30/17 0537   07/29/17 1615  ceFAZolin (ANCEF) IVPB 2g/100 mL premix  Status:  Discontinued     2 g 200 mL/hr over 30 Minutes Intravenous  Once 07/29/17 1408 07/29/17 2206   07/29/17 1417  ceFAZolin (ANCEF) 2-4 GM/100ML-% IVPB    Comments:  Wylene SimmerShepherd, Karen   : cabinet override      07/29/17 1417 07/30/17 0229        Objective:   Vitals:   07/31/17 2125 07/31/17 2226 08/01/17 0541 08/01/17 1011  BP: (!) 190/72 (!) 172/63 (!) 190/61 (!) 132/45  Pulse: (!) 103 (!) 103 89 81  Resp: 20 18 19 16   Temp: (!) 102.4 F (39.1 C) 98.9 F (37.2 C) 97.7 F (36.5  C) 98.1 F (36.7 C)  TempSrc: Oral Oral Oral Axillary  SpO2: 97% 98% 97% 98%  Weight:   70.5 kg (155 lb 7.9 oz)   Height:        Wt Readings from Last 3 Encounters:  08/01/17 70.5 kg (155 lb 7.9 oz)     Intake/Output Summary (Last 24 hours) at 08/01/2017 1936 Last data filed at 08/01/2017 1745 Gross per 24 hour  Intake 2109 ml  Output 2500 ml  Net -391 ml    Physical Exam  Gen:- Awake Alert,  In no apparent distress  HEENT:- [place].AT, No sclera icterus. HOH Neck-Supple Neck,No JVD,.  Lungs-  CTAB  CV- S1, S2 normal, 4/6 SM Abd-  +ve B.Sounds, Abd Soft, No tenderness,    Extremity/Skin:- No  edema,   Post op Rt Hip wound is C/D/I Psych-significant cognitive deficits but no significant behavioral problems Neuro-no new focal neuro deficits  GU- foley with clear urine- to be removed  Data Review:   Micro  Results Recent Results (from the past 240 hour(s))  MRSA PCR Screening     Status: None   Collection Time: 07/29/17  4:06 AM  Result Value Ref Range Status   MRSA by PCR NEGATIVE NEGATIVE Final    Comment:        The GeneXpert MRSA Assay (FDA approved for NASAL specimens only), is one component of a comprehensive MRSA colonization surveillance program. It is not intended to diagnose MRSA infection nor to guide or monitor treatment for MRSA infections.     Radiology Reports Dg Chest Port 1 View  Result Date: 07/28/2017 CLINICAL DATA:  Preoperative for hip fracture. EXAM: PORTABLE CHEST 1 VIEW COMPARISON:  08/08/2015 FINDINGS: Shallow inspiration with atelectasis in the lung bases. Cardiac enlargement with pulmonary vascular congestion. Mild interstitial pattern to the lungs likely represents mild interstitial edema superimposed upon mild chronic fibrosis. Emphysematous changes are suggested in the lungs. No focal consolidation. Calcification of the aorta. Degenerative changes in the shoulders. Loss of subacromial space may indicate chronic rotator cuff arthropathy. IMPRESSION: 1. Cardiac enlargement with mild vascular congestion and early interstitial edema. 2. Chronic emphysematous changes and fibrosis in the lungs. 3. Aortic atherosclerosis. Electronically Signed   By: Burman Nieves M.D.   On: 07/28/2017 20:59   Dg C-arm 1-60 Min-no Report  Result Date: 07/29/2017 Fluoroscopy was utilized by the requesting physician.  No radiographic interpretation.   Dg Hip Unilat W Or Wo Pelvis 2-3 Views Right  Result Date: 07/28/2017 CLINICAL DATA:  Patient fell and presents with right hip pain. EXAM: DG HIP (WITH OR WITHOUT PELVIS) 2-3V RIGHT COMPARISON:  None. FINDINGS: Acute, closed, varus angulated basicervical fracture of the right femur without joint dislocation. Intact bony pelvis. Lower lumbar fusion hardware across L4-5. Mild degenerative joint space narrowing both hips. Intact sacroiliac  joints and pubic symphysis. No diastasis. Native left hip appears intact. IMPRESSION: Varus angulated fracture at the base of the femoral neck without joint dislocation. Electronically Signed   By: Tollie Eth M.D.   On: 07/28/2017 18:57   Dg Femur, Min 2 Views Right  Result Date: 07/29/2017 CLINICAL DATA:  Right femoral fixation. EXAM: RIGHT FEMUR 2 VIEWS; DG C-ARM 1-60 MIN-NO REPORT COMPARISON:  Preoperative radiographs from 07/28/2017 FINDINGS: A single intraoperative AP view over the proximal right femur was provided status post right femoral nail fixation. Two screws are seen along the long axis of the right femoral neck traversing in hazy cervical fracture of the right femur. Alignment is near  anatomic. Fine bony detail is limited by the fluoroscopic technique. A total of 1 minutes 45 seconds of fluoroscopic time was utilized. IMPRESSION: Fluoroscopic time utilized with 1 AP view acquired status post intramedullary nail fixation of basicervical fracture of the right femur. No immediate intraoperative complications. Alignment is near anatomic. Electronically Signed   By: Tollie Eth M.D.   On: 07/29/2017 22:25     CBC Recent Labs  Lab 07/28/17 1822 07/29/17 0515 07/30/17 0513 07/31/17 0517 08/01/17 0539  WBC 8.9 8.7 10.8* 9.1 10.8*  HGB 10.6* 9.5* 8.3* 7.3* 11.1*  HCT 32.6* 28.8* 25.3* 22.4* 33.7*  PLT 226 204 169 142* 145*  MCV 93.9 93.8 94.4 95.7 92.6  MCH 30.5 30.9 31.0 31.2 30.5  MCHC 32.5 33.0 32.8 32.6 32.9  RDW 13.4 13.4 13.6 13.8 13.7  LYMPHSABS 1.2  --   --   --   --   MONOABS 0.6  --   --   --   --   EOSABS 0.1  --   --   --   --   BASOSABS 0.0  --   --   --   --     Chemistries  Recent Labs  Lab 07/28/17 1822 07/29/17 0515 07/30/17 0513 07/31/17 0517 08/01/17 0539  NA 137 136 137 136 135  K 4.4 4.5 4.7 4.3 4.5  CL 107 106 107 108 103  CO2 22 24 24 23 23   GLUCOSE 114* 151* 141* 112* 105*  BUN 42* 39* 33* 45* 41*  CREATININE 1.30* 1.21* 1.26* 1.31* 1.12*   CALCIUM 8.5* 8.6* 8.1* 7.8* 8.2*  AST 18  --   --   --   --   ALT 13*  --   --   --   --   ALKPHOS 78  --   --   --   --   BILITOT 0.6  --   --   --   --    ------------------------------------------------------------------------------------------------------------------ No results for input(s): CHOL, HDL, LDLCALC, TRIG, CHOLHDL, LDLDIRECT in the last 72 hours.  No results found for: HGBA1C ------------------------------------------------------------------------------------------------------------------ No results for input(s): TSH, T4TOTAL, T3FREE, THYROIDAB in the last 72 hours.  Invalid input(s): FREET3 ------------------------------------------------------------------------------------------------------------------ No results for input(s): VITAMINB12, FOLATE, FERRITIN, TIBC, IRON, RETICCTPCT in the last 72 hours.  Coagulation profile No results for input(s): INR, PROTIME in the last 168 hours.  No results for input(s): DDIMER in the last 72 hours.  Cardiac Enzymes No results for input(s): CKMB, TROPONINI, MYOGLOBIN in the last 168 hours.  Invalid input(s): CK ------------------------------------------------------------------------------------------------------------------ No results found for: BNP   Shon Hale M.D on 08/01/2017 at 7:36 PM  Between 7am to 7pm - Pager - [phone]  After 7pm go to [email or web address] - password [email or web address]  [facility] -  Office  [phone]   Voice Recognition Reubin Milan dictation system was used to create this note, attempts have been made to correct errors. Please contact the author with questions and/or clarifications.

## 2017-08-01 NOTE — Progress Notes (Signed)
Physical Therapy Treatment Patient Details Name: Amy Moody MRN: [id number] DOB: Feb 17, 1917 Today's Date: 08/01/2017    History of Present Illness Pt s/p R intramedullary fixation for right hip intertrochanteric fracture s/p fall.  At baseline pt has A with home with ADL activity due to dementia but was able to walk with a walker and at times without a walker. Pt has a VERY supportive family    PT Comments    +2 max assist for stand pivot transfer to recliner, activity tolerance limited by R hip pain. AAROM to RLE performed. SNF recommended.   Follow Up Recommendations  SNF     Equipment Recommendations  None recommended by PT    Recommendations for Other Services       Precautions / Restrictions Precautions Precautions: Fall Restrictions Weight Bearing Restrictions: No Other Position/Activity Restrictions: WBAT    Mobility  Bed Mobility Overal bed mobility: Needs Assistance Bed Mobility: Supine to Sit     Supine to sit: +2 for physical assistance;Total assist     General bed mobility comments: +2 total assist for supine to sit  Transfers Overall transfer level: Needs assistance Equipment used: Rolling walker (2 wheeled);2 person hand held assist   Sit to Stand: +2 physical assistance;Max assist Stand pivot transfers: Max assist;+2 physical assistance;+2 safety/equipment       General transfer comment: attempted sit to stand with RW, pt stood with flexed posture and posterior lean, unable to come fully upright, maintained weight more on RLE than LLE; SPT with +2 side by side assist  Ambulation/Gait                 Stairs            Wheelchair Mobility    Modified Rankin (Stroke Patients Only)       Balance Overall balance assessment: Needs assistance Sitting-balance support: Bilateral upper extremity supported;Feet supported Sitting balance-Leahy Scale: Fair     Standing balance support: Bilateral upper extremity supported;During  functional activity Standing balance-Leahy Scale: Poor                              Cognition Arousal/Alertness: Awake/alert Behavior During Therapy: WFL for tasks assessed/performed Overall Cognitive Status: History of cognitive impairments - at baseline                                        Exercises Total Joint Exercises Ankle Circles/Pumps: AROM;Both;10 reps Heel Slides: AAROM;Right;10 reps;Supine Hip ABduction/ADduction: AAROM;Right;10 reps;Supine    General Comments        Pertinent Vitals/Pain Faces Pain Scale: Hurts even more Pain Location: with movement in R hip  Pain Descriptors / Indicators: Grimacing;Guarding Pain Intervention(s): Limited activity within patient's tolerance;Monitored during session;Premedicated before session;Repositioned;Ice applied    Home Living                      Prior Function            PT Goals (current goals can now be found in the care plan section) Acute Rehab PT Goals Patient Stated Goal: get back home PT Goal Formulation: With family Time For Goal Achievement: 08/13/17 Potential to Achieve Goals: Fair Progress towards PT goals: Progressing toward goals    Frequency    Min 4X/week      PT Plan Current plan remains appropriate  Co-evaluation              AM-PAC PT "6 Clicks" Daily Activity  Outcome Measure  Difficulty turning over in bed (including adjusting bedclothes, sheets and blankets)?: Unable Difficulty moving from lying on back to sitting on the side of the bed? : Unable Difficulty sitting down on and standing up from a chair with arms (e.g., wheelchair, bedside commode, etc,.)?: Unable Help needed moving to and from a bed to chair (including a wheelchair)?: Total Help needed walking in hospital room?: Total Help needed climbing 3-5 steps with a railing? : Total 6 Click Score: 6    End of Session Equipment Utilized During Treatment: Gait belt Activity  Tolerance: Patient limited by pain Patient left: in chair;with call bell/phone within reach;with family/visitor present Nurse Communication: Mobility status PT Visit Diagnosis: Unsteadiness on feet (R26.81);History of falling (Z91.81);Pain Pain - Right/Left: Right Pain - part of body: Hip     Time: [id number] PT Time Calculation (min) (ACUTE ONLY): 27 min  Charges:  $Therapeutic Exercise: 8-22 mins $Therapeutic Activity: 8-22 mins                    G Codes:          Tamala SerUhlenberg, Eloisa Chokshi Kistler 08/01/2017, 11:19 AM [phone]

## 2017-08-01 NOTE — Care Management Important Message (Signed)
Important Message  Patient Details  Name: Amy Moody MRN: [id number] Date of Birth: 1916/11/12   Medicare Important Message Given:  Yes    Caren MacadamFuller, Garold Sheeler 08/01/2017, 11:19 AMImportant Message  Patient Details  Name: Amy Moody MRN: [id number] Date of Birth: 1916/11/12   Medicare Important Message Given:  Yes    Caren MacadamFuller, Tywon Niday 08/01/2017, 11:19 AM

## 2017-08-02 DIAGNOSIS — Z7982 Long term (current) use of aspirin: Secondary | ICD-10-CM | POA: Diagnosis not present

## 2017-08-02 DIAGNOSIS — R1310 Dysphagia, unspecified: Secondary | ICD-10-CM | POA: Diagnosis not present

## 2017-08-02 DIAGNOSIS — R8290 Unspecified abnormal findings in urine: Secondary | ICD-10-CM | POA: Diagnosis not present

## 2017-08-02 DIAGNOSIS — S72001D Fracture of unspecified part of neck of right femur, subsequent encounter for closed fracture with routine healing: Secondary | ICD-10-CM | POA: Diagnosis not present

## 2017-08-02 DIAGNOSIS — F0280 Dementia in other diseases classified elsewhere without behavioral disturbance: Secondary | ICD-10-CM | POA: Diagnosis not present

## 2017-08-02 DIAGNOSIS — D649 Anemia, unspecified: Secondary | ICD-10-CM | POA: Diagnosis not present

## 2017-08-02 DIAGNOSIS — R54 Age-related physical debility: Secondary | ICD-10-CM | POA: Diagnosis not present

## 2017-08-02 DIAGNOSIS — A0472 Enterocolitis due to Clostridium difficile, not specified as recurrent: Secondary | ICD-10-CM | POA: Diagnosis not present

## 2017-08-02 DIAGNOSIS — K5900 Constipation, unspecified: Secondary | ICD-10-CM | POA: Diagnosis not present

## 2017-08-02 DIAGNOSIS — E860 Dehydration: Secondary | ICD-10-CM | POA: Diagnosis not present

## 2017-08-02 DIAGNOSIS — F0390 Unspecified dementia without behavioral disturbance: Secondary | ICD-10-CM | POA: Diagnosis not present

## 2017-08-02 DIAGNOSIS — S72001A Fracture of unspecified part of neck of right femur, initial encounter for closed fracture: Secondary | ICD-10-CM | POA: Diagnosis not present

## 2017-08-02 DIAGNOSIS — R0600 Dyspnea, unspecified: Secondary | ICD-10-CM | POA: Diagnosis not present

## 2017-08-02 DIAGNOSIS — R159 Full incontinence of feces: Secondary | ICD-10-CM | POA: Diagnosis not present

## 2017-08-02 DIAGNOSIS — D500 Iron deficiency anemia secondary to blood loss (chronic): Secondary | ICD-10-CM | POA: Diagnosis not present

## 2017-08-02 DIAGNOSIS — R509 Fever, unspecified: Secondary | ICD-10-CM | POA: Diagnosis not present

## 2017-08-02 DIAGNOSIS — N3289 Other specified disorders of bladder: Secondary | ICD-10-CM | POA: Diagnosis not present

## 2017-08-02 DIAGNOSIS — R1311 Dysphagia, oral phase: Secondary | ICD-10-CM | POA: Diagnosis not present

## 2017-08-02 DIAGNOSIS — K219 Gastro-esophageal reflux disease without esophagitis: Secondary | ICD-10-CM | POA: Diagnosis not present

## 2017-08-02 DIAGNOSIS — R2681 Unsteadiness on feet: Secondary | ICD-10-CM | POA: Diagnosis not present

## 2017-08-02 DIAGNOSIS — L89150 Pressure ulcer of sacral region, unstageable: Secondary | ICD-10-CM | POA: Diagnosis not present

## 2017-08-02 DIAGNOSIS — G8911 Acute pain due to trauma: Secondary | ICD-10-CM | POA: Diagnosis not present

## 2017-08-02 DIAGNOSIS — B999 Unspecified infectious disease: Secondary | ICD-10-CM | POA: Diagnosis not present

## 2017-08-02 DIAGNOSIS — N390 Urinary tract infection, site not specified: Secondary | ICD-10-CM | POA: Diagnosis not present

## 2017-08-02 DIAGNOSIS — I10 Essential (primary) hypertension: Secondary | ICD-10-CM | POA: Diagnosis not present

## 2017-08-02 DIAGNOSIS — N183 Chronic kidney disease, stage 3 (moderate): Secondary | ICD-10-CM | POA: Diagnosis not present

## 2017-08-02 DIAGNOSIS — R0603 Acute respiratory distress: Secondary | ICD-10-CM | POA: Diagnosis not present

## 2017-08-02 DIAGNOSIS — Z9181 History of falling: Secondary | ICD-10-CM | POA: Diagnosis not present

## 2017-08-02 DIAGNOSIS — M6281 Muscle weakness (generalized): Secondary | ICD-10-CM | POA: Diagnosis not present

## 2017-08-02 DIAGNOSIS — R4789 Other speech disturbances: Secondary | ICD-10-CM | POA: Diagnosis not present

## 2017-08-02 DIAGNOSIS — I129 Hypertensive chronic kidney disease with stage 1 through stage 4 chronic kidney disease, or unspecified chronic kidney disease: Secondary | ICD-10-CM | POA: Diagnosis not present

## 2017-08-02 DIAGNOSIS — R3981 Functional urinary incontinence: Secondary | ICD-10-CM | POA: Diagnosis not present

## 2017-08-02 DIAGNOSIS — G309 Alzheimer's disease, unspecified: Secondary | ICD-10-CM | POA: Diagnosis not present

## 2017-08-02 DIAGNOSIS — S72009A Fracture of unspecified part of neck of unspecified femur, initial encounter for closed fracture: Secondary | ICD-10-CM | POA: Diagnosis not present

## 2017-08-02 DIAGNOSIS — R531 Weakness: Secondary | ICD-10-CM | POA: Diagnosis not present

## 2017-08-02 DIAGNOSIS — J189 Pneumonia, unspecified organism: Secondary | ICD-10-CM | POA: Diagnosis not present

## 2017-08-02 DIAGNOSIS — J22 Unspecified acute lower respiratory infection: Secondary | ICD-10-CM | POA: Diagnosis not present

## 2017-08-02 DIAGNOSIS — M170 Bilateral primary osteoarthritis of knee: Secondary | ICD-10-CM | POA: Diagnosis not present

## 2017-08-02 DIAGNOSIS — R32 Unspecified urinary incontinence: Secondary | ICD-10-CM | POA: Diagnosis not present

## 2017-08-02 DIAGNOSIS — S72141D Displaced intertrochanteric fracture of right femur, subsequent encounter for closed fracture with routine healing: Secondary | ICD-10-CM | POA: Diagnosis not present

## 2017-08-02 DIAGNOSIS — L89009 Pressure ulcer of unspecified elbow, unspecified stage: Secondary | ICD-10-CM | POA: Diagnosis not present

## 2017-08-02 DIAGNOSIS — N189 Chronic kidney disease, unspecified: Secondary | ICD-10-CM | POA: Diagnosis not present

## 2017-08-02 DIAGNOSIS — E46 Unspecified protein-calorie malnutrition: Secondary | ICD-10-CM | POA: Diagnosis not present

## 2017-08-02 LAB — CBC
HCT: 33.7 % — ABNORMAL LOW (ref 36.0–46.0)
HEMATOCRIT: 26.3 % — AB (ref 36.0–46.0)
HEMOGLOBIN: 11.1 g/dL — AB (ref 12.0–15.0)
HEMOGLOBIN: 8.7 g/dL — AB (ref 12.0–15.0)
MCH: 30.5 pg (ref 26.0–34.0)
MCH: 31.1 pg (ref 26.0–34.0)
MCHC: 32.9 g/dL (ref 30.0–36.0)
MCHC: 33.1 g/dL (ref 30.0–36.0)
MCV: 92.6 fL (ref 78.0–100.0)
MCV: 93.9 fL (ref 78.0–100.0)
Platelets: 145 10*3/uL — ABNORMAL LOW (ref 150–400)
Platelets: 138 10*3/uL — ABNORMAL LOW (ref 150–400)
RBC: 3.64 MIL/uL — AB (ref 3.87–5.11)
RBC: 2.8 MIL/uL — ABNORMAL LOW (ref 3.87–5.11)
RDW: 13.7 % (ref 11.5–15.5)
RDW: 13.8 % (ref 11.5–15.5)
WBC: 10.8 10*3/uL — ABNORMAL HIGH (ref 4.0–10.5)
WBC: 7.2 10*3/uL (ref 4.0–10.5)

## 2017-08-02 MED ORDER — ONDANSETRON HCL 4 MG PO TABS: 4.0000 mg | ORAL_TABLET | Freq: Four times a day (QID) | ORAL | 0 refills | Status: AC | PRN

## 2017-08-02 MED ORDER — SENNOSIDES-DOCUSATE SODIUM 8.6-50 MG PO TABS: 2.0000 | ORAL_TABLET | Freq: Two times a day (BID) | ORAL | 1 refills | Status: AC

## 2017-08-02 MED ORDER — BISACODYL 5 MG PO TBEC: 5.0000 mg | DELAYED_RELEASE_TABLET | Freq: Every day | ORAL | 0 refills | Status: AC | PRN

## 2017-08-02 MED ORDER — FERROUS SULFATE 325 (65 FE) MG PO TBEC: 325.0000 mg | DELAYED_RELEASE_TABLET | Freq: Every day | ORAL | 0 refills | Status: AC

## 2017-08-02 MED ORDER — POLYETHYLENE GLYCOL 3350 17 G PO PACK: 17.0000 g | PACK | Freq: Once | ORAL | Status: DC

## 2017-08-02 MED ORDER — TAMSULOSIN HCL 0.4 MG PO CAPS: 0.4000 mg | ORAL_CAPSULE | Freq: Every day | ORAL | 1 refills | Status: AC

## 2017-08-02 MED ORDER — ENSURE ENLIVE PO LIQD: 237.0000 mL | Freq: Two times a day (BID) | ORAL | 12 refills | Status: AC

## 2017-08-02 MED ORDER — METOPROLOL TARTRATE 25 MG PO TABS: 12.5000 mg | ORAL_TABLET | Freq: Two times a day (BID) | ORAL | 0 refills | Status: AC

## 2017-08-02 NOTE — Discharge Instructions (Signed)
Full wright as tolerated right hip. New dressings to right hip incision as needed.  1)Wound care consult for blisters on buttocks and Rt Hip Wound 2)Ooutpatient follow-up with urology in 7-10 days 3)Continue Routine indwelling Foley catheter care until patient is seen by urology as outpatient 4)Repeat CBC and BMP in 5-7 days

## 2017-08-02 NOTE — Progress Notes (Signed)
Made Emokpae,MD aware of patients bladder scan results. MD stated to place foley catheter and leave in place and discharge to facility.

## 2017-08-02 NOTE — Progress Notes (Signed)
Call[facility] report to Beth,[facility].

## 2017-08-02 NOTE — Clinical Social Work Placement (Addendum)
[facility] called for transport.  D/C Summary sent to facility.  CLINICAL SOCIAL WORK PLACEMENT  NOTE  Date:  08/02/2017  Patient Details  Name: Amy Moody MRN: [id number] Date of Birth: October 27, 1916  Clinical Social Work is seeking post-discharge placement for this patient at the Skilled  Nursing Facility level of care (*CSW will initial, date and re-position this form in  chart as items are completed):  Yes   Patient/family provided with [place] Clinical Social Work Department's list of facilities offering this level of care within the geographic area requested by the patient (or if unable, by the patient's family).  Yes   Patient/family informed of their freedom to choose among providers that offer the needed level of care, that participate in Medicare, Medicaid or managed care program needed by the patient, have an available bed and are willing to accept the patient.  Yes   Patient/family informed of [facility] ownership interest in [facility] and [facility], as well as of the fact that they are under no obligation to receive care at these facilities.  PASRR submitted to EDS on 07/31/17     PASRR number received on 07/31/17     Existing PASRR number confirmed on       FL2 transmitted to all facilities in geographic area requested by pt/family on       FL2 transmitted to all facilities within larger geographic area on 08/02/17     Patient informed that his/her managed care company has contracts with or will negotiate with certain facilities, including the following:  [facility]         Patient/family informed of bed offers received.  [facility]     Physician recommends and patient chooses bed at      [facility].  Patient to be transferred to facility by [facility]      Patient family notified on 08/02/17 of transfer.  Name of family member notified:        PHYSICIAN Please  prepare priority discharge summary, including medications, Please sign DNR     Additional Comment:    _______________________________________________ Clearance CootsNicole A Renella Steig, LCSW 08/02/2017, 9:19 AM

## 2017-08-02 NOTE — Progress Notes (Signed)
Nutrition Follow-up  DOCUMENTATION CODES:   Not applicable  INTERVENTION:  - Continue Ensure Enlive BID. - Continue to encourage PO intakes.   NUTRITION DIAGNOSIS:   Increased nutrient needs related to hip fracture as evidenced by estimated needs. -ongoing  GOAL:   Patient will meet greater than or equal to 90% of their needs -met  MONITOR:   PO intake, Supplement acceptance, Weight trends, Labs, Skin  ASSESSMENT:   Pt with PMH of HTN, dementia, and CKD presents s/p mechanical fall with closed R hip fracture  1/8 Diet advanced from NPO to Regular on 1/5 at 6:56 AM. Pt has been consuming 50-100% of meals since that time. Ensure Enlive ordered BID on that date and pt has been accepting all supplements since order was placed. Weight -4 lbs/2 kg from 1/5-1/7 and no new weight this AM.   Medications reviewed. Labs reviewed; BUN: 41 mg/dL, creatinine: 1.12 mg/dL, Ca: 8.2 mg/dL, GFR: 46 mL/min.   IVF: NS @ 40 mL/hr.     1/4 Unable to see pt at visit, in OR for ORIF No meal completion recorded as pt has been NPO for surgery. Pt with no recent weight history available per chart.       Diet Order:  Diet regular Room service appropriate? Yes; Fluid consistency: Thin  EDUCATION NEEDS:   Not appropriate for education at this time  Skin:  Skin Assessment: Skin Integrity Issues: Skin Integrity Issues:: Incisions Incisions: R hip from 07/29/17  Last BM:  07/28/17  Height:   Ht Readings from Last 1 Encounters:  07/28/17 5' 3"  (1.6 m)    Weight:   Wt Readings from Last 1 Encounters:  08/01/17 155 lb 7.9 oz (70.5 kg)    Ideal Body Weight:  52.3 kg  BMI:  Body mass index is 27.54 kg/m.  Estimated Nutritional Needs:   Kcal:  1450-1650  Protein:  70-80 grams  Fluid:  >/= 1.4 L/d      Jarome Matin, MS, RD, LDN, [facility] Inpatient Clinical Dietitian Pager # [phone] After hours/weekend pager # [phone]

## 2017-08-02 NOTE — Discharge Summary (Signed)
Amy Moody, is a 84 y.o. female  DOB Nov 12, 1916  MRN [id number].  Admission date:  07/28/2017  Admitting Physician  Briscoe Deutscher, MD  Discharge Date:  08/02/2017   Primary MD  Patient, No Pcp Per  Recommendations for primary care physician for things to follow:  Wound care consult please Outpatient neurology follow-up in 7-10 days please Repeat CBC and BMP in 5 to 7 days  Admission Diagnosis  Right hip pain [M25.551] Preoperative testing [Z01.818] Closed right hip fracture, initial encounter [facility]) [S72.001A]   Discharge Diagnosis  Right hip pain [M25.551] Preoperative testing [Z01.818] Closed right hip fracture, initial encounter (HCC) [S72.001A]    Principal Problem:   Closed right hip fracture, initial encounter [facility]) Active Problems:   Dementia   Hypertension   Renal disorder   Normocytic anemia      Past Medical History:  Diagnosis Date  . Dementia   . Hypertension   . Renal disorder    Chronic Kidney Disease     Past Surgical History:  Procedure Laterality Date  . BACK SURGERY    . CESAREAN SECTION    . HERNIA REPAIR    . INTRAMEDULLARY (IM) NAIL INTERTROCHANTERIC Right 07/29/2017   Procedure: INTRAMEDULLARY (IM) NAIL INTERTROCHANTRIC;  Surgeon: Kathryne Hitch, MD;  Location: [facility];  Service: Orthopedics;  Laterality: Right;      HPI  from the history and physical done on the day of admission:    Patient coming from: Home  Chief Complaint: Fall with severe right hip pain   HPI: Amy Moody is a 84 y.o. female with medical history significant for dementia, hypertension, and chronic kidney disease, now presenting to the emergency department with severe right hip pain after a fall at home.  Patient is accompanied by family members who assist with the history.  She had reportedly been in her usual state of health and was having an uneventful day when she  was trying to get into a car and stumbled, falling backwards onto her right hip.  She did not hit her head or lose consciousness.  She complained of immediate and severe pain at the right hip and was unable to bear weight.  EMS was called out, she was treated with fentanyl in the field, and transported to the hospital.  He denies any recent illness, fevers, chills, chest pain, cough, or shortness of breath.  ED Course: Upon arrival to the ED, patient is found to be afebrile, saturating well on room air, hypertensive to 190/90, and with vitals otherwise normal.  EKG has been ordered and remains pending.  Chest x-ray is notable for cardiac enlargement with mild vascular congestion and early interstitial edema, as well as chronic emphysematous changes and likely fibrosis.  Radiographs of the right hip demonstrate a varus angulated fracture at the base of the femoral neck without dislocation.  Patient was treated with 0.5 mg of Dilaudid in the ED and orthopedic surgery was consulted.  Consultant recommended a medical admission with the patient to be  n.p.o. after midnight for likely surgical repair.  She remained hemodynamically stable, in no apparent respiratory distress, and will be admitted to the medical-surgical unit for ongoing evaluation and management of right hip fracture.    Hospital Course:    1)Right hip fracture- s/p ORIF by Dr Magnus Ivan on 07/29/17,   -Presented with severe right hip pain following a ground-level mechanical fall at home, reports that she simply tripped while trying to get into a car, No Head injury or LOC, continue rehab   2)Hypertension- stable, lisinopril on hold due to [facility], start metoprolol 12.5 mg twice daily this can be titrated up for heart rate and blood pressure control  3)AKI- Resolved creatinine is 1.1,  baseline creatinine around 1.1, Lisinopril dced   4)Acute on chronic chronic Normocytic anemia- baseline hemoglobin usually around 11, hemoglobin  is down up  to 8.7 from  7.3 post transfusion of 1 unit of PRBCs in 07/31/17, give iron sulfate 1 tablet daily  5)Dementia- Stable, continue Razadyne  6)Urinary Retention-patient developed postop urinary retention, failed voiding trial, okay to discharge to rehab with Foley catheter in situ, Flomax 0.4 mg daily,, follow-up with urologist advised in 7-10 days  7) decubitus - buttock area with blisters suspect pressure injury, superimposed cellulitis, please get wound care consult at rehab facility to recommend dressing changes   Discharge Condition: stable  Follow UP   Contact information for follow-up providers    Kathryne Hitch, MD. Schedule an appointment as soon as possible for a visit in 2 week(s).   Specialty:  Orthopedic Surgery Contact information: [address] [place] [address] [phone]            Contact information for after-discharge care    Destination    [facility] .   Service:  Skilled Nursing Contact information: [address] [place] [address] [phone]                   Consults obtained - ortho/PT/SW  Diet and Activity recommendation:  As advised  Discharge Instructions    Discharge Instructions    Call MD for:  difficulty breathing, headache or visual disturbances   Complete by:  As directed    Call MD for:  persistant dizziness or light-headedness   Complete by:  As directed    Call MD for:  persistant nausea and vomiting   Complete by:  As directed    Call MD for:  redness, tenderness, or signs of infection (pain, swelling, redness, odor or green/yellow discharge around incision site)   Complete by:  As directed    Call MD for:  temperature >100.4   Complete by:  As directed    Diet - low sodium heart healthy   Complete by:  As directed    Discharge instructions   Complete by:  As directed    1)Wound care consult for blisters on buttocks and Rt Hip Wound 2)Ooutpatient  follow-up with urology in 7-10 days 3)Continue Routine indwelling Foley catheter care until patient is seen by urology as outpatient 4)Repeat CBC and BMP in 5-7 days   Increase activity slowly   Complete by:  As directed         Discharge Medications     Allergies as of 08/02/2017      Reactions   Penicillins Other (See Comments)   Has patient had a PCN reaction causing immediate rash, facial/tongue/throat swelling, SOB or lightheadedness with hypotension: Unknown Has patient had a PCN  reaction causing severe rash involving mucus membranes or skin necrosis: Unknown Has patient had a PCN reaction that required hospitalization: unknown Has patient had a PCN reaction occurring within the last 10 years: No If all of the above answers are "NO", then may proceed with Cephalosporin use.      Medication List    STOP taking these medications   lisinopril 5 MG tablet Commonly known as:  PRINIVIL,ZESTRIL     TAKE these medications   acetaminophen 650 MG CR tablet Commonly known as:  TYLENOL Take 1,300 mg by mouth daily.   aspirin 325 MG EC tablet Take 1 tablet (325 mg total) by mouth daily with breakfast.   bisacodyl 5 MG EC tablet Commonly known as:  DULCOLAX Take 1 tablet (5 mg total) by mouth daily as needed for moderate constipation.   feeding supplement (ENSURE ENLIVE) Liqd Take 237 mLs by mouth 2 (two) times daily between meals. Start taking on:  08/03/2017   ferrous sulfate 325 (65 FE) MG EC tablet Take 1 tablet (325 mg total) by mouth daily with breakfast.   galantamine 8 MG 24 hr capsule Commonly known as:  RAZADYNE ER Take 8 mg by mouth daily. Morning with food   HYDROcodone-acetaminophen 5-325 MG tablet Commonly known as:  NORCO/VICODIN Take 1-2 tablets by mouth every 6 (six) hours as needed for moderate pain.   metoprolol tartrate 25 MG tablet Commonly known as:  LOPRESSOR Take 0.5 tablets (12.5 mg total) by mouth 2 (two) times daily.   ondansetron 4 MG  tablet Commonly known as:  ZOFRAN Take 1 tablet (4 mg total) by mouth every 6 (six) hours as needed for nausea.   senna-docusate 8.6-50 MG tablet Commonly known as:  Senokot-S Take 2 tablets by mouth 2 (two) times daily.   tamsulosin 0.4 MG Caps capsule Commonly known as:  FLOMAX Take 1 capsule (0.4 mg total) by mouth daily after supper.       Major procedures and Radiology Reports - PLEASE review detailed and final reports for all details, in brief -   Dg Chest Port 1 View  Result Date: 07/28/2017 CLINICAL DATA:  Preoperative for hip fracture. EXAM: PORTABLE CHEST 1 VIEW COMPARISON:  08/08/2015 FINDINGS: Shallow inspiration with atelectasis in the lung bases. Cardiac enlargement with pulmonary vascular congestion. Mild interstitial pattern to the lungs likely represents mild interstitial edema superimposed upon mild chronic fibrosis. Emphysematous changes are suggested in the lungs. No focal consolidation. Calcification of the aorta. Degenerative changes in the shoulders. Loss of subacromial space may indicate chronic rotator cuff arthropathy. IMPRESSION: 1. Cardiac enlargement with mild vascular congestion and early interstitial edema. 2. Chronic emphysematous changes and fibrosis in the lungs. 3. Aortic atherosclerosis. Electronically Signed   By: Burman Nieves M.D.   On: 07/28/2017 20:59   Dg C-arm 1-60 Min-no Report  Result Date: 07/29/2017 Fluoroscopy was utilized by the requesting physician.  No radiographic interpretation.   Dg Hip Unilat W Or Wo Pelvis 2-3 Views Right  Result Date: 07/28/2017 CLINICAL DATA:  Patient fell and presents with right hip pain. EXAM: DG HIP (WITH OR WITHOUT PELVIS) 2-3V RIGHT COMPARISON:  None. FINDINGS: Acute, closed, varus angulated basicervical fracture of the right femur without joint dislocation. Intact bony pelvis. Lower lumbar fusion hardware across L4-5. Mild degenerative joint space narrowing both hips. Intact sacroiliac joints and pubic  symphysis. No diastasis. Native left hip appears intact. IMPRESSION: Varus angulated fracture at the base of the femoral neck without joint dislocation. Electronically Signed  By: Tollie Ethavid  Kwon M.D.   On: 07/28/2017 18:57   Dg Femur, Min 2 Views Right  Result Date: 07/29/2017 CLINICAL DATA:  Right femoral fixation. EXAM: RIGHT FEMUR 2 VIEWS; DG C-ARM 1-60 MIN-NO REPORT COMPARISON:  Preoperative radiographs from 07/28/2017 FINDINGS: A single intraoperative AP view over the proximal right femur was provided status post right femoral nail fixation. Two screws are seen along the long axis of the right femoral neck traversing in hazy cervical fracture of the right femur. Alignment is near anatomic. Fine bony detail is limited by the fluoroscopic technique. A total of 1 minutes 45 seconds of fluoroscopic time was utilized. IMPRESSION: Fluoroscopic time utilized with 1 AP view acquired status post intramedullary nail fixation of basicervical fracture of the right femur. No immediate intraoperative complications. Alignment is near anatomic. Electronically Signed   By: Tollie Ethavid  Kwon M.D.   On: 07/29/2017 22:25    Micro Results    Recent Results (from the past 240 hour(s))  MRSA PCR Screening     Status: None   Collection Time: 07/29/17  4:06 AM  Result Value Ref Range Status   MRSA by PCR NEGATIVE NEGATIVE Final    Comment:        The GeneXpert MRSA Assay (FDA approved for NASAL specimens only), is one component of a comprehensive MRSA colonization surveillance program. It is not intended to diagnose MRSA infection nor to guide or monitor treatment for MRSA infections.        Today   Subjective    Amy Moody today has no new complaints, son at bedside, patient unable to void, fever no chills, oral intake is fair          Patient has been seen and examined prior to discharge   Objective   Blood pressure (!) 133/57, pulse (!) 110, temperature 99.5 F (37.5 C), temperature source  Oral, resp. rate 16, height 5\' 3"  (1.6 m), weight 70.5 kg (155 lb 7.9 oz), SpO2 97 %.   Intake/Output Summary (Last 24 hours) at 08/02/2017 1446 Last data filed at 08/02/2017 1422 Gross per 24 hour  Intake 980 ml  Output 700 ml  Net 280 ml    Exam  Gen:- Awake Alert,  In no apparent distress  HEENT:- [place].AT, No sclera icterus. HOH Neck-Supple Neck,No JVD,.  Lungs-  CTAB  CV- S1, S2 normal, 4/6 SM Abd-  +ve B.Sounds, Abd Soft, No tenderness,    Extremity/Skin:- No  edema,   Post op Rt Hip wound is C/D/I, buttock area with blisters suspect pressure injury, No superimposed cellulitis Psych-significant cognitive deficits but no significant behavioral problems Neuro-no new focal neuro deficits  GU- foley with clear urine- failed Voiding trial     Data Review   CBC w Diff:  Lab Results  Component Value Date   WBC 7.2 08/02/2017   HGB 8.7 (L) 08/02/2017   HCT 26.3 (L) 08/02/2017   PLT 138 (L) 08/02/2017   LYMPHOPCT 14 07/28/2017   MONOPCT 7 07/28/2017   EOSPCT 2 07/28/2017   BASOPCT 0 07/28/2017    CMP:  Lab Results  Component Value Date   NA 135 08/01/2017   K 4.5 08/01/2017   CL 103 08/01/2017   CO2 23 08/01/2017   BUN 41 (H) 08/01/2017   CREATININE 1.12 (H) 08/01/2017   PROT 7.2 07/28/2017   ALBUMIN 3.7 07/28/2017   BILITOT 0.6 07/28/2017   ALKPHOS 78 07/28/2017   AST 18 07/28/2017   ALT 13 (L) 07/28/2017  .  Total Discharge time is about 33 minutes  Shon Hale M.D on 08/02/2017 at 2:46 PM  [facility]   Office  [phone]  Voice Recognition Reubin Milan dictation system was used to create this note, attempts have been made to correct errors. Please contact the author with questions and/or clarifications.

## 2017-08-02 NOTE — Progress Notes (Signed)
Physical Therapy Treatment Patient Details Name: Amy Moody MRN: [id number] DOB: [birth year] Today's Date: 08/02/2017    History of Present Illness Pt s/p R intramedullary fixation for right hip intertrochanteric fracture s/p fall.  At baseline pt has A with home with ADL activity due to dementia but was able to walk with a walker and at times without a walker. Pt has a VERY supportive family    PT Comments    POD # 4  Assisted OOB to [facility] for attempted void as pt present with impaired cognition and only AxO to self.  Attempted amb + 2 side by side with a third person following with recliner.    Follow Up Recommendations  SNF     Equipment Recommendations  None recommended by PT    Recommendations for Other Services       Precautions / Restrictions Precautions Precautions: Fall Precaution Comments: ST memory  Restrictions Weight Bearing Restrictions: No Other Position/Activity Restrictions: WBAT    Mobility  Bed Mobility Overal bed mobility: Needs Assistance Bed Mobility: Supine to Sit     Supine to sit: +2 for physical assistance;Total assist(pt 5%)     General bed mobility comments: +2 total assist for supine to sit with grimacing with pain and repeats "Oh".   Transfers Overall transfer level: Needs assistance Equipment used: None Transfers: Stand Pivot Transfers;Sit to/from Stand(pt 5%) Sit to Stand: Total assist;+2 safety/equipment;+2 physical assistance Stand pivot transfers: Total assist;+2 physical assistance;+2 safety/equipment(pt 0%)       General transfer comment: assisted OOB to [facility] then from [facility] to attempt amb.  Very limited activity tolerance due to pain and impaired cognition.  Ambulation/Gait Ambulation/Gait assistance: Total assist;+2 physical assistance Ambulation Distance (Feet): 3 Feet Assistive device: Rolling walker (2 wheeled) Gait Pattern/deviations: Step-to pattern;Step-through pattern;Decreased step length - right;Decreased step  length - left;Shuffle;Narrow base of support Gait velocity: decreased   General Gait Details: Total Assist +2 side by with a thrid person following with recliner.  Very limited amb distance due to pain level and decreased cognition.     Stairs            Wheelchair Mobility    Modified Rankin (Stroke Patients Only)       Balance                                            Cognition Arousal/Alertness: Awake/alert                                     General Comments: required repeat functional VC's and increased time to process.      Exercises      General Comments        Pertinent Vitals/Pain Pain Assessment: Faces Faces Pain Scale: Hurts whole lot Pain Location: pt responds to "Oh" Pain Descriptors / Indicators: Grimacing;Guarding Pain Intervention(s): Monitored during session;Repositioned;Ice applied    Home Living                      Prior Function            PT Goals (current goals can now be found in the care plan section) Progress towards PT goals: Progressing toward goals    Frequency    Min 4X/week      PT Plan  Current plan remains appropriate    Co-evaluation              AM-PAC PT "6 Clicks" Daily Activity  Outcome Measure  Difficulty turning over in bed (including adjusting bedclothes, sheets and blankets)?: Unable Difficulty moving from lying on back to sitting on the side of the bed? : Unable Difficulty sitting down on and standing up from a chair with arms (e.g., wheelchair, bedside commode, etc,.)?: Unable Help needed moving to and from a bed to chair (including a wheelchair)?: Total Help needed walking in hospital room?: Total Help needed climbing 3-5 steps with a railing? : Total 6 Click Score: 6    End of Session Equipment Utilized During Treatment: Gait belt Activity Tolerance: Patient limited by pain;Other (comment)(cognition) Patient left: in chair;with call bell/phone  within reach;with family/visitor present Nurse Communication: Mobility status PT Visit Diagnosis: Unsteadiness on feet (R26.81);History of falling (Z91.81);Pain Pain - Right/Left: Right Pain - part of body: Hip     Time: 1102-1130 PT Time Calculation (min) (ACUTE ONLY): 28 min  Charges:  $Gait Training: 8-22 mins $Therapeutic Activity: 8-22 mins                    G Codes:       Felecia ShellingLori Tamala Manzer  PTA [facility]  Acute  Rehab Pager      [phone]

## 2017-08-02 NOTE — Progress Notes (Signed)
Dr.Emokpae aware of patients "blister like" areas to buttock and left hip. No new orders.

## 2017-08-02 NOTE — Progress Notes (Signed)
Attempted to call report twice to the number the social worker provided [phone]. No one answered phone and no voicemail.

## 2017-08-16 ENCOUNTER — Inpatient Hospital Stay (INDEPENDENT_AMBULATORY_CARE_PROVIDER_SITE_OTHER): Payer: Medicare Other | Admitting: Orthopaedic Surgery

## 2017-08-23 ENCOUNTER — Ambulatory Visit (INDEPENDENT_AMBULATORY_CARE_PROVIDER_SITE_OTHER): Payer: No Typology Code available for payment source

## 2017-08-23 ENCOUNTER — Ambulatory Visit (INDEPENDENT_AMBULATORY_CARE_PROVIDER_SITE_OTHER): Payer: Medicare Other | Admitting: Orthopaedic Surgery

## 2017-08-23 DIAGNOSIS — R3981 Functional urinary incontinence: Secondary | ICD-10-CM | POA: Diagnosis not present

## 2017-08-23 DIAGNOSIS — S72143A Displaced intertrochanteric fracture of unspecified femur, initial encounter for closed fracture: Secondary | ICD-10-CM | POA: Insufficient documentation

## 2017-08-23 DIAGNOSIS — L89150 Pressure ulcer of sacral region, unstageable: Secondary | ICD-10-CM | POA: Diagnosis not present

## 2017-08-23 DIAGNOSIS — R159 Full incontinence of feces: Secondary | ICD-10-CM | POA: Diagnosis not present

## 2017-08-23 DIAGNOSIS — S72141D Displaced intertrochanteric fracture of right femur, subsequent encounter for closed fracture with routine healing: Secondary | ICD-10-CM | POA: Diagnosis not present

## 2017-08-23 NOTE — Progress Notes (Signed)
Office Visit Note   Patient: Amy Moody           Date of Birth: 11-12-1916           MRN: [id number] Visit Date: 08/23/2017              Requested by: Lenell AntuLe, Thao P, DO [address] [place], [place] [id number] PCP: Lenell AntuLe, Thao P, DO   Assessment & Plan: Visit Diagnoses:  1. Closed displaced intertrochanteric fracture of right femur with routine healing, subsequent encounter     Plan: She is weightbearing as tolerated right leg.  Staples were removed today Steri-Strips applied she is able to get the incision wet in shower.  She will work with physical therapy on gait balance range of motion and strengthening right hip.  Also will have him work on range of motion of the right knee.  She has some arthritic changes of the right knee and may benefit from a cortisone injection in the future.  Questions are encouraged and answered of patient's family members who were present today.  Follow-Up Instructions: Return in about 4 weeks (around 09/20/2017).   Orders:  Orders Placed This Encounter  Procedures  . XR HIP UNILAT W OR W/O PELVIS 1V RIGHT   No orders of the defined types were placed in this encounter.     Procedures: No procedures performed   Clinical Data: No additional findings.   Subjective: Chief Complaint  Patient presents with  . Right Hip - Follow-up, Routine Post Op    HPI Mrs. Amy Moody returns today 2 weeks status post intramedullary nailing of a right hip intertrochanteric fracture.  She is at [facility] skilled facility.  Patient's family members present with her today and a caregiver from the skilled facility.  She is doing some transfers but no real ambulation at this point.  She is overall been doing well according to the family member and caregivers are present. Review of Systems No fevers chills shortness of breath chest pain  Objective: Vital Signs: There were no vitals taken for this visit.  Physical Exam General: Frail-appearing individual  in no acute distress. Ortho Exam Right hip surgical incisions are all healing well approximated with staples no signs of infection.  Right calf supple nontender.  Right knee flexed at approximately 15 degrees over time able to bring near full extension. Specialty Comments:  No specialty comments available.  Imaging: Xr Hip Unilat W Or W/o Pelvis 1v Right  Result Date: 08/23/2017 AP pelvis lateral view of the right  femur: Status post IM nailing intertrochanteric fracture without any evidence of hardware failure.  Hip fracture is overall in good position alignment near-anatomic.  Arthritic changes in the knee are noted.  No acute fractures.    PMFS History: Patient Active Problem List   Diagnosis Date Noted  . Hip fracture, intertrochanteric (HCC) 08/23/2017  . Dementia 07/28/2017  . Hypertension 07/28/2017  . Renal disorder 07/28/2017  . Closed right hip fracture, initial encounter (HCC) 07/28/2017  . Normocytic anemia 07/28/2017   Past Medical History:  Diagnosis Date  . Dementia   . Hypertension   . Renal disorder    Chronic Kidney Disease     No family history on file.  Past Surgical History:  Procedure Laterality Date  . BACK SURGERY    . CESAREAN SECTION    . HERNIA REPAIR    . INTRAMEDULLARY (IM) NAIL INTERTROCHANTERIC Right 07/29/2017   Procedure: INTRAMEDULLARY (IM) NAIL INTERTROCHANTRIC;  Surgeon:  Kathryne Hitch, MD;  Location: [facility]S;  Service: Orthopedics;  Laterality: Right;   Social History   Occupational History  . Not on file  Tobacco Use  . Smoking status: Former Games developer  . Smokeless tobacco: Never Used  Substance and Sexual Activity  . Alcohol use: No    Frequency: Never  . Drug use: No  . Sexual activity: Not on file

## 2017-08-30 DIAGNOSIS — R54 Age-related physical debility: Secondary | ICD-10-CM | POA: Diagnosis not present

## 2017-08-30 DIAGNOSIS — R0603 Acute respiratory distress: Secondary | ICD-10-CM | POA: Diagnosis not present

## 2017-08-30 DIAGNOSIS — R8290 Unspecified abnormal findings in urine: Secondary | ICD-10-CM | POA: Diagnosis not present

## 2017-08-30 DIAGNOSIS — L89150 Pressure ulcer of sacral region, unstageable: Secondary | ICD-10-CM | POA: Diagnosis not present

## 2017-08-30 DIAGNOSIS — S72001D Fracture of unspecified part of neck of right femur, subsequent encounter for closed fracture with routine healing: Secondary | ICD-10-CM | POA: Diagnosis not present

## 2017-08-30 DIAGNOSIS — A0472 Enterocolitis due to Clostridium difficile, not specified as recurrent: Secondary | ICD-10-CM | POA: Diagnosis not present

## 2017-08-30 DIAGNOSIS — R509 Fever, unspecified: Secondary | ICD-10-CM | POA: Diagnosis not present

## 2017-08-30 DIAGNOSIS — J22 Unspecified acute lower respiratory infection: Secondary | ICD-10-CM | POA: Diagnosis not present

## 2017-08-30 DIAGNOSIS — E860 Dehydration: Secondary | ICD-10-CM | POA: Diagnosis not present

## 2017-09-05 DIAGNOSIS — R531 Weakness: Secondary | ICD-10-CM | POA: Diagnosis not present

## 2017-09-23 DEATH — deceased

## 2017-09-26 ENCOUNTER — Ambulatory Visit (INDEPENDENT_AMBULATORY_CARE_PROVIDER_SITE_OTHER): Payer: Medicare Other | Admitting: Orthopaedic Surgery

## 2019-08-29 IMAGING — CR DG HIP (WITH OR WITHOUT PELVIS) 2-3V*R*
3 series · 3 of 3 positions shown · non-contrast
Comparison: None.

CLINICAL DATA: Patient fell and presents with right hip pain.

EXAM:
DG HIP (WITH OR WITHOUT PELVIS) 2-3V RIGHT

[x pelvis]
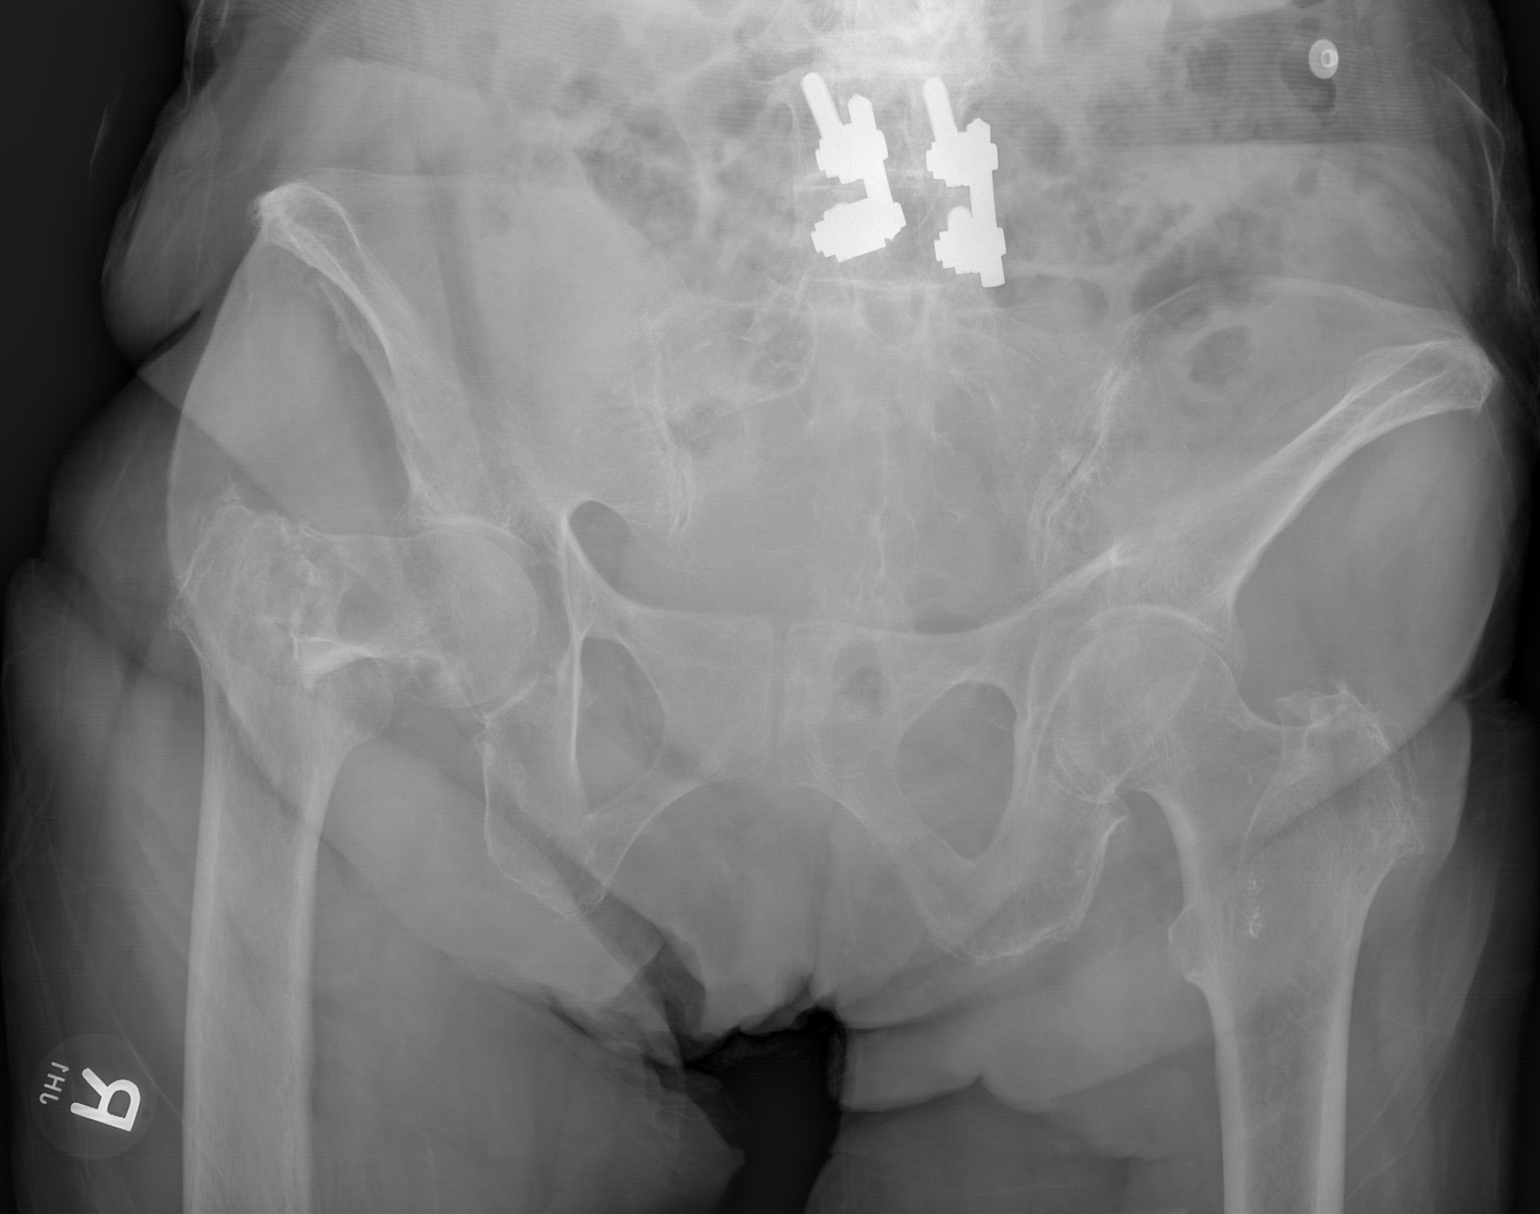

[x hip ap right]
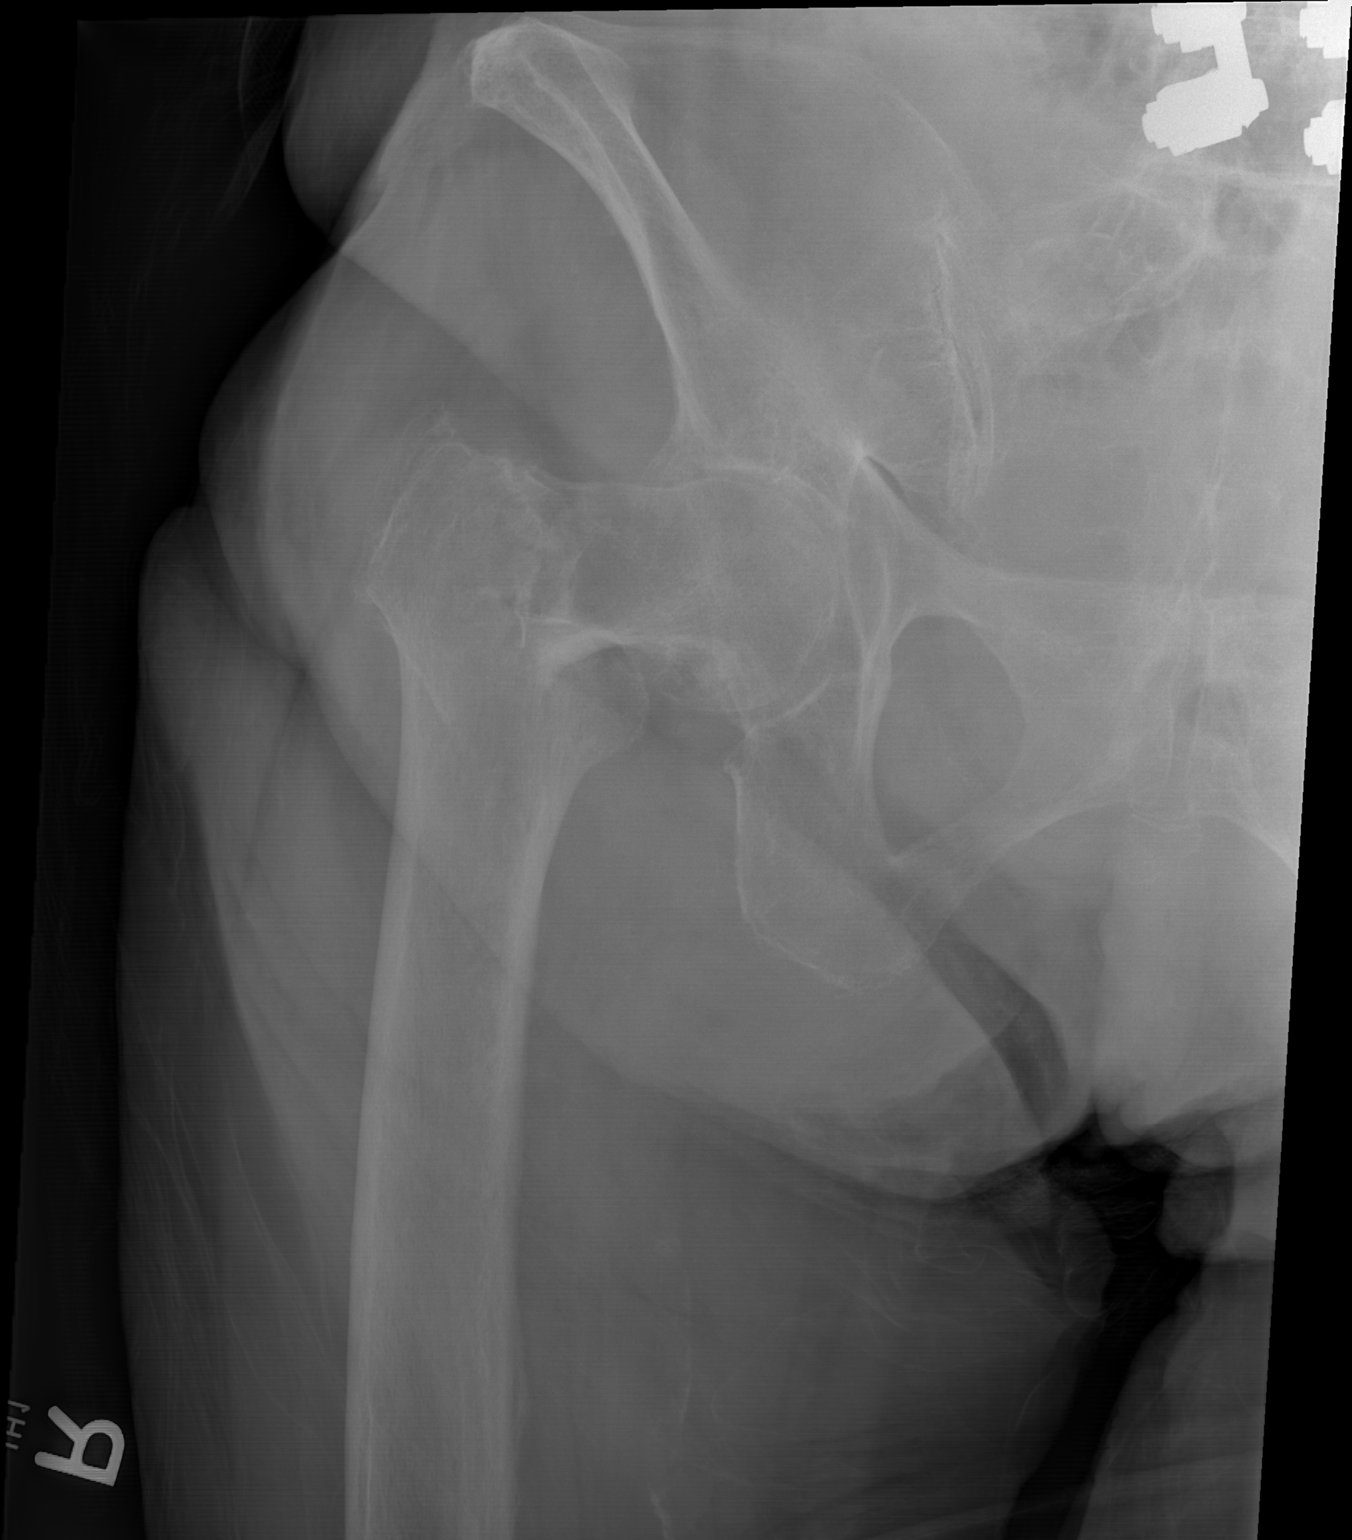

[w hip lat right]
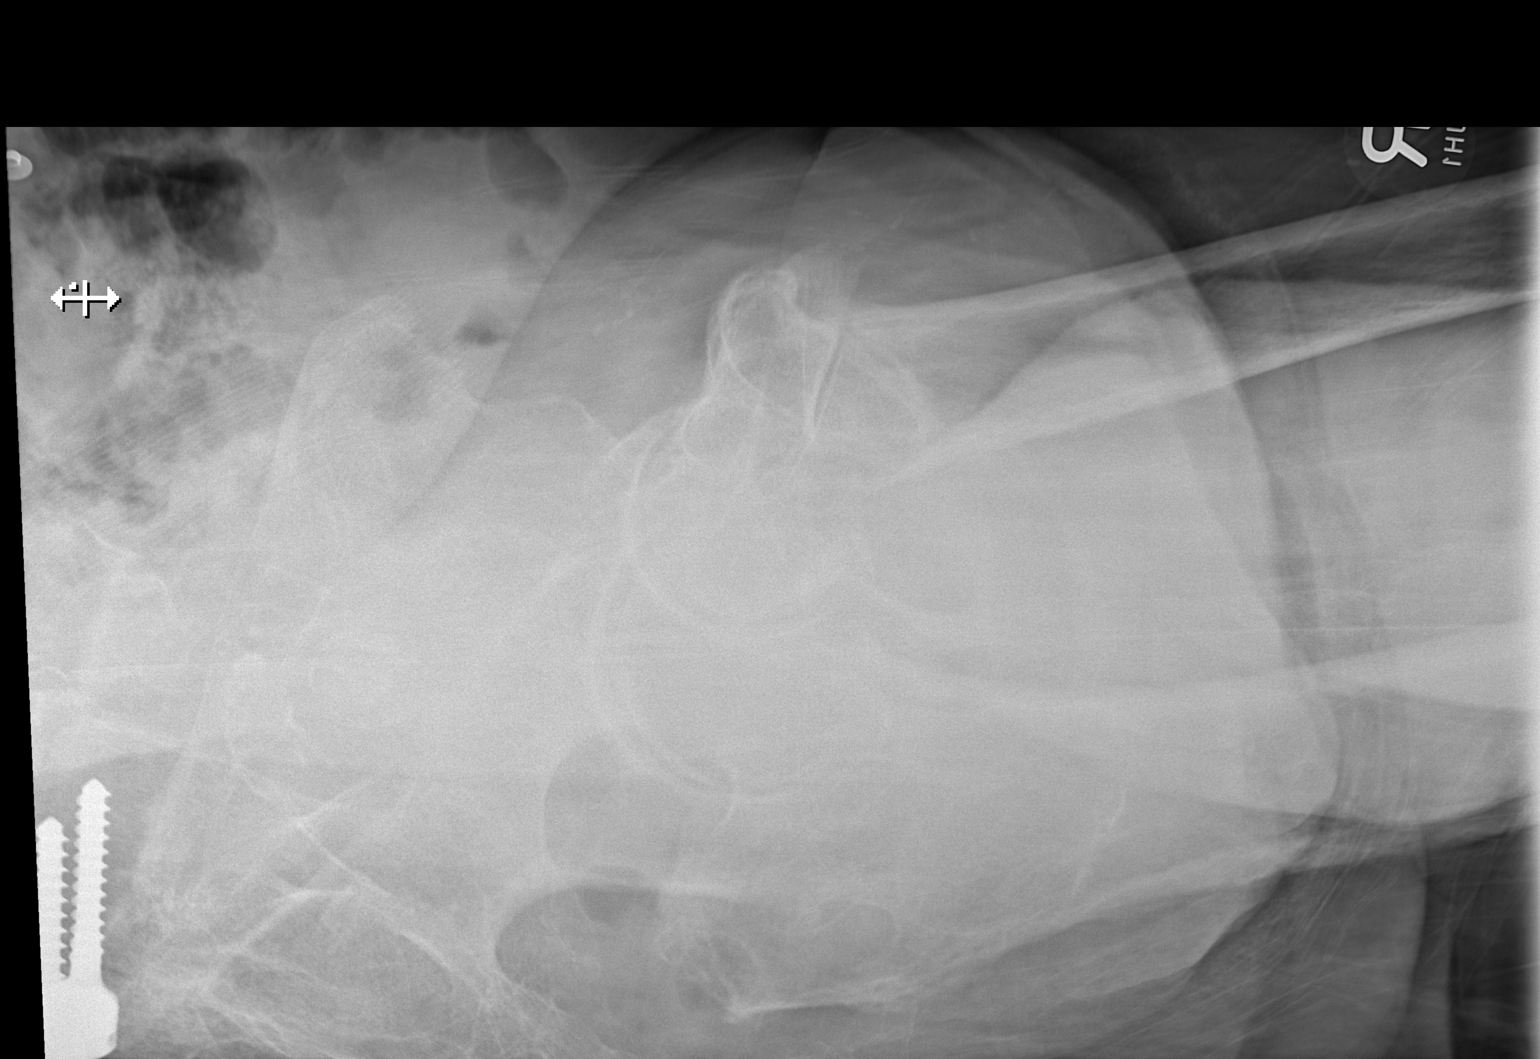

[3 of 3 positions shown; findings below may reference images not displayed]

FINDINGS: Acute, closed, varus angulated basicervical fracture of the right
femur without joint dislocation. Intact bony pelvis. Lower lumbar
fusion hardware across L4-5. Mild degenerative joint space narrowing
both hips. Intact sacroiliac joints and pubic symphysis. No
diastasis. Native left hip appears intact.
IMPRESSION: Varus angulated fracture at the base of the femoral neck without
joint dislocation.

## 2019-08-29 IMAGING — DX DG CHEST 1V PORT
1 series · 1 of 1 positions shown · non-contrast
Comparison: 08/08/2015

CLINICAL DATA: Preoperative for hip fracture.

EXAM:
PORTABLE CHEST 1 VIEW

[chest ap]
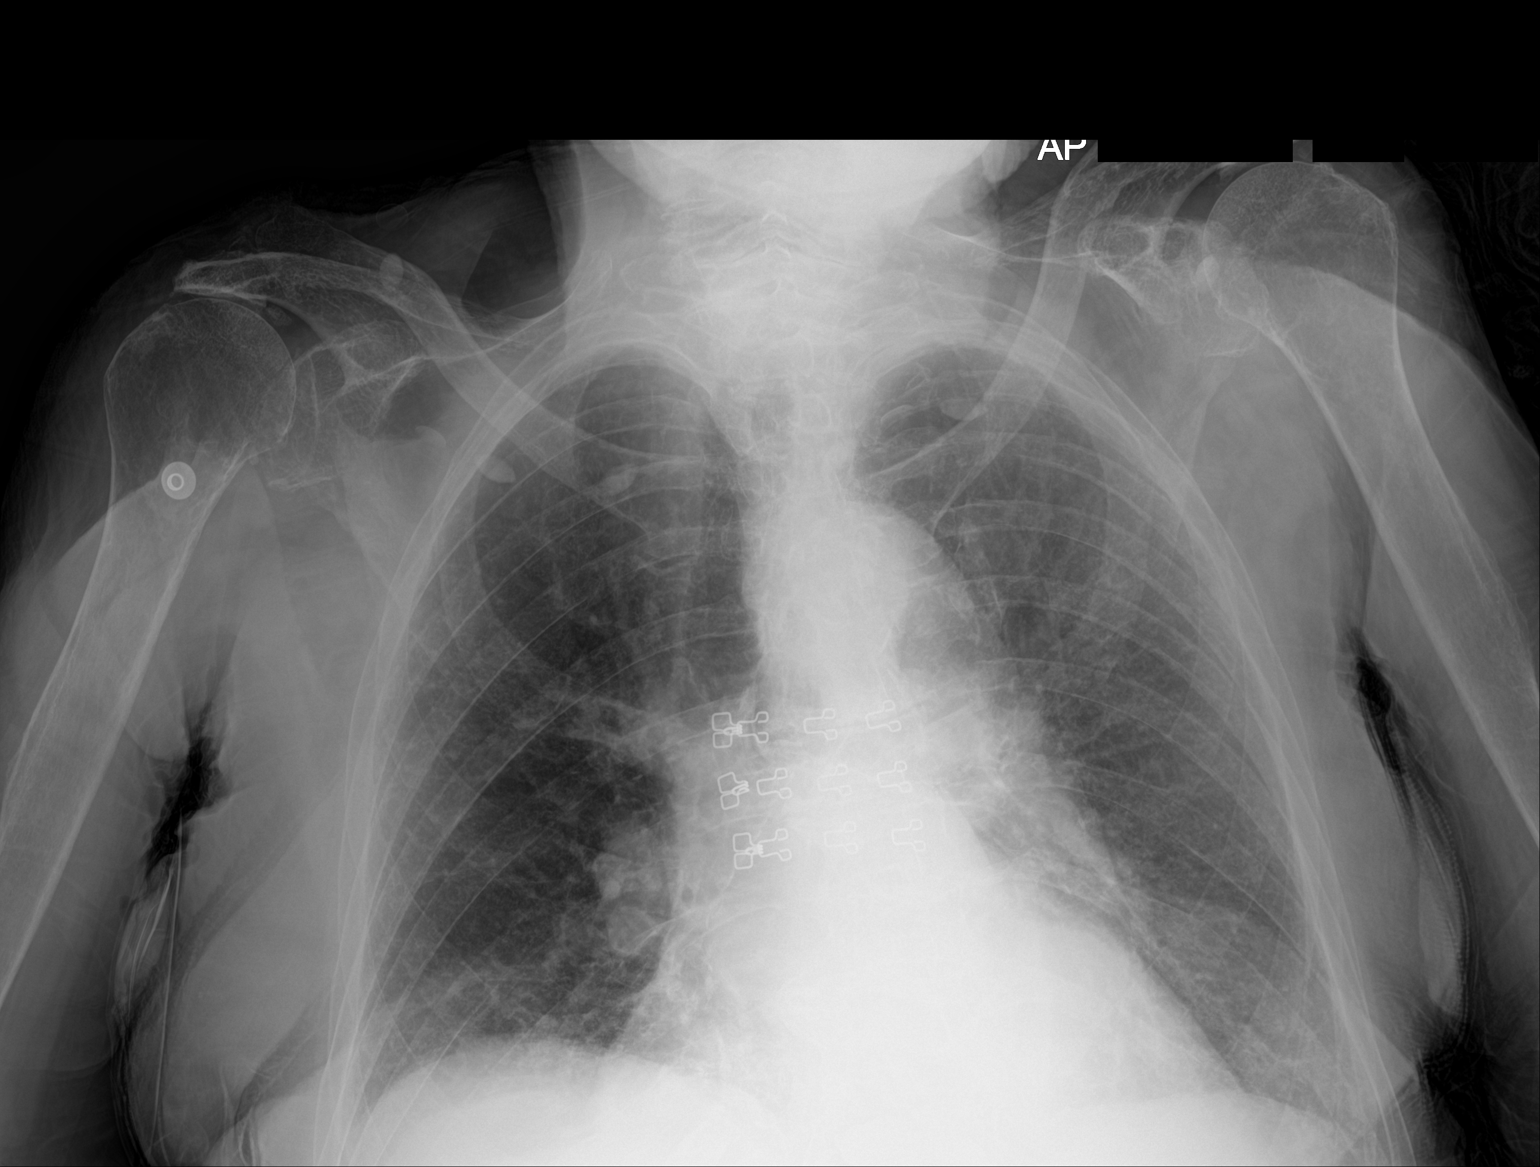

[1 of 1 positions shown; findings below may reference images not displayed]

FINDINGS: Shallow inspiration with atelectasis in the lung bases. Cardiac
enlargement with pulmonary vascular congestion. Mild interstitial
pattern to the lungs likely represents mild interstitial edema
superimposed upon mild chronic fibrosis. Emphysematous changes are
suggested in the lungs. No focal consolidation. Calcification of the
aorta. Degenerative changes in the shoulders. Loss of subacromial
space may indicate chronic rotator cuff arthropathy.
IMPRESSION: 1. Cardiac enlargement with mild vascular congestion and early
interstitial edema.
2. Chronic emphysematous changes and fibrosis in the lungs.
3. Aortic atherosclerosis.
